# Patient Record
Sex: Female | Born: 1945 | Race: White | Hispanic: No | Marital: Married | Smoking: Former smoker
Health system: Southern US, Community
[De-identification: ages and names within clinical notes are randomized; demographics above are authoritative.]

## PROBLEM LIST (undated history)

## (undated) DIAGNOSIS — I10 Essential (primary) hypertension: Secondary | ICD-10-CM

## (undated) DIAGNOSIS — E7800 Pure hypercholesterolemia, unspecified: Secondary | ICD-10-CM

## (undated) DIAGNOSIS — K219 Gastro-esophageal reflux disease without esophagitis: Secondary | ICD-10-CM

## (undated) HISTORY — PX: ABDOMINAL HYSTERECTOMY: SHX81

---

## 2006-04-16 ENCOUNTER — Ambulatory Visit (HOSPITAL_COMMUNITY): Admission: RE | Admit: 2006-04-16 | Discharge: 2006-04-16 | Payer: Self-pay | Admitting: General Surgery

## 2006-05-12 ENCOUNTER — Ambulatory Visit (HOSPITAL_COMMUNITY): Admission: RE | Admit: 2006-05-12 | Discharge: 2006-05-12 | Payer: Self-pay | Admitting: Pediatrics

## 2007-06-24 ENCOUNTER — Ambulatory Visit (HOSPITAL_COMMUNITY): Admission: RE | Admit: 2007-06-24 | Discharge: 2007-06-24 | Payer: Self-pay | Admitting: Family Medicine

## 2008-11-26 ENCOUNTER — Ambulatory Visit (HOSPITAL_COMMUNITY): Admission: RE | Admit: 2008-11-26 | Discharge: 2008-11-26 | Payer: Self-pay | Admitting: Family Medicine

## 2010-03-04 ENCOUNTER — Ambulatory Visit (HOSPITAL_COMMUNITY): Admission: RE | Admit: 2010-03-04 | Discharge: 2010-03-04 | Payer: Self-pay | Admitting: Family Medicine

## 2010-04-27 ENCOUNTER — Encounter: Payer: Self-pay | Admitting: General Surgery

## 2011-05-26 ENCOUNTER — Other Ambulatory Visit (HOSPITAL_COMMUNITY): Payer: Self-pay | Admitting: Family Medicine

## 2011-05-26 DIAGNOSIS — Z139 Encounter for screening, unspecified: Secondary | ICD-10-CM

## 2011-05-29 ENCOUNTER — Ambulatory Visit (HOSPITAL_COMMUNITY): Payer: Self-pay

## 2011-06-02 ENCOUNTER — Ambulatory Visit (HOSPITAL_COMMUNITY)
Admission: RE | Admit: 2011-06-02 | Discharge: 2011-06-02 | Disposition: A | Discharge status: Home / Self Care | Payer: Self-pay | Source: Ambulatory Visit | Attending: Family Medicine | Admitting: Family Medicine

## 2011-06-02 DIAGNOSIS — Z139 Encounter for screening, unspecified: Secondary | ICD-10-CM

## 2012-08-16 ENCOUNTER — Other Ambulatory Visit (HOSPITAL_COMMUNITY): Payer: Self-pay | Admitting: Internal Medicine

## 2012-08-16 DIAGNOSIS — Z139 Encounter for screening, unspecified: Secondary | ICD-10-CM

## 2012-08-23 ENCOUNTER — Encounter (INDEPENDENT_AMBULATORY_CARE_PROVIDER_SITE_OTHER): Payer: Self-pay | Admitting: *Deleted

## 2012-08-25 ENCOUNTER — Ambulatory Visit (HOSPITAL_COMMUNITY)
Admission: RE | Admit: 2012-08-25 | Discharge: 2012-08-25 | Disposition: A | Discharge status: Home / Self Care | Payer: Medicare HMO | Source: Ambulatory Visit | Attending: Internal Medicine | Admitting: Internal Medicine

## 2012-08-25 DIAGNOSIS — Z1231 Encounter for screening mammogram for malignant neoplasm of breast: Secondary | ICD-10-CM | POA: Insufficient documentation

## 2012-08-25 DIAGNOSIS — Z139 Encounter for screening, unspecified: Secondary | ICD-10-CM

## 2012-12-28 ENCOUNTER — Other Ambulatory Visit (INDEPENDENT_AMBULATORY_CARE_PROVIDER_SITE_OTHER): Payer: Self-pay | Admitting: *Deleted

## 2012-12-28 ENCOUNTER — Telehealth (INDEPENDENT_AMBULATORY_CARE_PROVIDER_SITE_OTHER): Payer: Self-pay | Admitting: *Deleted

## 2012-12-28 ENCOUNTER — Encounter (INDEPENDENT_AMBULATORY_CARE_PROVIDER_SITE_OTHER): Payer: Self-pay | Admitting: *Deleted

## 2012-12-28 DIAGNOSIS — Z1211 Encounter for screening for malignant neoplasm of colon: Secondary | ICD-10-CM

## 2012-12-28 MED ORDER — PEG-KCL-NACL-NASULF-NA ASC-C 100 G PO SOLR: 1.0000 | Freq: Once | ORAL | Status: DC

## 2012-12-28 NOTE — Telephone Encounter (Signed)
Patient needs movi prep 

## 2013-01-24 ENCOUNTER — Encounter (INDEPENDENT_AMBULATORY_CARE_PROVIDER_SITE_OTHER): Payer: Self-pay | Admitting: *Deleted

## 2013-02-06 ENCOUNTER — Telehealth (INDEPENDENT_AMBULATORY_CARE_PROVIDER_SITE_OTHER): Payer: Self-pay | Admitting: *Deleted

## 2013-02-06 NOTE — Telephone Encounter (Signed)
  Procedure: tcs  Reason/Indication:  screening  Has patient had this procedure before?  no  If so, when, by whom and where?    Is there a family history of colon cancer?  no  Who?  What age when diagnosed?    Is patient diabetic?   no      Does patient have prosthetic heart valve?  no  Do you have a pacemaker?  no  Has patient ever had endocarditis? no  Has patient had joint replacement within last 12 months?  no  Does patient tend to be constipated or take laxatives? no  Is patient on Coumadin, Plavix and/or Aspirin? yes  Medications: asa prn, lisinopril 20 mg daily, pravastatin 20 mg daily, fish oil 1000 mg daily, multi vit daily, calcium daily  Allergies: nkda  Medication Adjustment: asa 2 days  Procedure date & time: 02/19/13 at 830

## 2013-02-08 NOTE — Telephone Encounter (Signed)
agree

## 2013-02-13 ENCOUNTER — Other Ambulatory Visit (HOSPITAL_COMMUNITY): Payer: Self-pay | Admitting: Internal Medicine

## 2013-02-13 DIAGNOSIS — M8580 Other specified disorders of bone density and structure, unspecified site: Secondary | ICD-10-CM

## 2013-02-17 ENCOUNTER — Ambulatory Visit (HOSPITAL_COMMUNITY)
Admission: RE | Admit: 2013-02-17 | Discharge: 2013-02-17 | Disposition: A | Discharge status: Home / Self Care | Payer: Medicare HMO | Source: Ambulatory Visit | Attending: Internal Medicine | Admitting: Internal Medicine

## 2013-02-17 DIAGNOSIS — Z780 Asymptomatic menopausal state: Secondary | ICD-10-CM | POA: Insufficient documentation

## 2013-02-17 DIAGNOSIS — M8580 Other specified disorders of bone density and structure, unspecified site: Secondary | ICD-10-CM

## 2013-02-17 DIAGNOSIS — M810 Age-related osteoporosis without current pathological fracture: Secondary | ICD-10-CM | POA: Insufficient documentation

## 2013-02-21 ENCOUNTER — Encounter (HOSPITAL_COMMUNITY): Payer: Self-pay

## 2013-03-01 ENCOUNTER — Encounter (HOSPITAL_COMMUNITY): Payer: Self-pay | Admitting: *Deleted

## 2013-03-01 ENCOUNTER — Ambulatory Visit (HOSPITAL_COMMUNITY)
Admission: RE | Admit: 2013-03-01 | Discharge: 2013-03-01 | Disposition: A | Discharge status: Home / Self Care | Payer: Medicare HMO | Source: Ambulatory Visit | Attending: Internal Medicine | Admitting: Internal Medicine

## 2013-03-01 ENCOUNTER — Encounter (HOSPITAL_COMMUNITY)
Admission: RE | Disposition: A | Discharge status: Home / Self Care | Payer: Self-pay | Source: Ambulatory Visit | Attending: Internal Medicine

## 2013-03-01 DIAGNOSIS — K5730 Diverticulosis of large intestine without perforation or abscess without bleeding: Secondary | ICD-10-CM | POA: Insufficient documentation

## 2013-03-01 DIAGNOSIS — Z1211 Encounter for screening for malignant neoplasm of colon: Secondary | ICD-10-CM

## 2013-03-01 DIAGNOSIS — Z800 Family history of malignant neoplasm of digestive organs: Secondary | ICD-10-CM | POA: Insufficient documentation

## 2013-03-01 DIAGNOSIS — I10 Essential (primary) hypertension: Secondary | ICD-10-CM | POA: Insufficient documentation

## 2013-03-01 HISTORY — DX: Gastro-esophageal reflux disease without esophagitis: K21.9

## 2013-03-01 HISTORY — PX: COLONOSCOPY: SHX5424

## 2013-03-01 HISTORY — DX: Essential (primary) hypertension: I10

## 2013-03-01 SURGERY — COLONOSCOPY
Anesthesia: Moderate Sedation

## 2013-03-01 MED ORDER — MIDAZOLAM HCL 5 MG/5ML IJ SOLN
INTRAMUSCULAR | Status: DC | PRN
  Administered 2013-03-01 (×2): 2 mg via INTRAVENOUS
  Administered 2013-03-01: 1 mg via INTRAVENOUS

## 2013-03-01 MED ORDER — MIDAZOLAM HCL 5 MG/5ML IJ SOLN
INTRAMUSCULAR | Status: AC
  Filled 2013-03-01: qty 10

## 2013-03-01 MED ORDER — MEPERIDINE HCL 50 MG/ML IJ SOLN
INTRAMUSCULAR | Status: DC | PRN
  Administered 2013-03-01: 15 mg via INTRAVENOUS
  Administered 2013-03-01: 25 mg via INTRAVENOUS

## 2013-03-01 MED ORDER — SODIUM CHLORIDE 0.9 % IV SOLN
INTRAVENOUS | Status: DC
  Administered 2013-03-01: 1000 mL via INTRAVENOUS

## 2013-03-01 MED ORDER — STERILE WATER FOR IRRIGATION IR SOLN
Status: DC | PRN
  Administered 2013-03-01: 10:00:00

## 2013-03-01 MED ORDER — MEPERIDINE HCL 50 MG/ML IJ SOLN
INTRAMUSCULAR | Status: AC
  Filled 2013-03-01: qty 1

## 2013-03-01 NOTE — Op Note (Signed)
COLONOSCOPY PROCEDURE REPORT  PATIENT:  Teresa Howe  MR#:  [id number] Birthdate:  Apr 28, 1945, 67 y.o., female Endoscopist:  Dr. Malissa Hippo, MD Referred By:  Dr. Dwana Melena, MD Procedure Date: 03/01/2013  Procedure:   Colonoscopy  Indications:  Patient is  67 year old Caucasian female who is undergoing high-risk screening colonoscopy. Her sister had surgery for colon carcinoma in late 69s and doing fine at age 30. This is patient's first exam.  Informed Consent:  The procedure and risks were reviewed with the patient and informed consent was obtained.  Medications:  Demerol 40 mg IV Versed 5 mg IV  Description of procedure:  After a digital rectal exam was performed, that colonoscope was advanced from the anus through the rectum and colon to the area of the cecum, ileocecal valve and appendiceal orifice. The cecum was deeply intubated. These structures were well-seen and photographed for the record. From the level of the cecum and ileocecal valve, the scope was slowly and cautiously withdrawn. The mucosal surfaces were carefully surveyed utilizing scope tip to flexion to facilitate fold flattening as needed. The scope was pulled down into the rectum where a thorough exam including retroflexion was performed.  Findings:  Prep satisfactory. Scattered diverticula throughout the colon but no polyps or other mucosal abnormalities noted.  Normal rectal mucosa and anorectal junction.   Therapeutic/Diagnostic Maneuvers Performed:   None  Complications:  None  Cecal Withdrawal Time:  8 minutes  Impression:  Examination performed to cecum. Pancolonic diverticulosis.  Recommendations:  Standard instructions given. Next screening exam in 5 years.Marland Kitchen  REHMAN,NAJEEB U  03/01/2013 11:00 AM  CC: Dr. Catalina Pizza, MD & Dr. Bonnetta Barry ref. provider found

## 2013-03-01 NOTE — H&P (Signed)
Teresa Howe is an 67 y.o. female.   Chief Complaint: Patient is here for colonoscopy. HPI: This 67 year old Caucasian female presents for screening colonoscopy. She denies abdominal pain change in bowel habits rectal bleeding. Family history significant for colon carcinoma sister with surgery in her late 77s and now 72 and doing well.  Past Medical History  Diagnosis Date  . Hypertension   . GERD (gastroesophageal reflux disease)     Past Surgical History  Procedure Laterality Date  . Abdominal hysterectomy      No family history on file. Social History:  has no tobacco, alcohol, and drug history on file.  Allergies: No Known Allergies  Medications Prior to Admission  Medication Sig Dispense Refill  . calcium carbonate (OS-CAL) 600 MG TABS tablet Take 600 mg by mouth every other day.      . lisinopril-hydrochlorothiazide (PRINZIDE,ZESTORETIC) 20-12.5 MG per tablet Take 1 tablet by mouth daily.      . Multiple Vitamins-Minerals (MULTIVITAMINS THER. W/MINERALS) TABS tablet Take 1 tablet by mouth daily.      . Omega-3 Fatty Acids (FISH OIL) 1000 MG CAPS Take 1 capsule by mouth 3 (three) times daily.      . peg 3350 powder (MOVIPREP) 100 G SOLR Take 1 kit (200 g total) by mouth once.  1 kit  0  . pravastatin (PRAVACHOL) 20 MG tablet Take 20 mg by mouth at bedtime.        No results found for this or any previous visit (from the past 48 hour(s)). No results found.  ROS  Blood pressure 105/69, pulse 73, temperature 97 F (36.1 C), temperature source Axillary, resp. rate 16, height 4\' 11"  (1.499 m), weight 104 lb (47.174 kg), SpO2 100.00%. Physical Exam  Constitutional: She appears well-developed and well-nourished.  HENT:  Mouth/Throat: Oropharynx is clear and moist.  Eyes: Conjunctivae are normal. No scleral icterus.  Neck: No thyromegaly present.  Cardiovascular: Normal rate, regular rhythm and normal heart sounds.   No murmur heard. Respiratory: Effort normal and  breath sounds normal.  GI: Soft. She exhibits no distension and no mass. There is no tenderness.  Musculoskeletal: She exhibits no edema.  Lymphadenopathy:    She has no cervical adenopathy.  Neurological: She is alert.  Skin: Skin is warm and dry.     Assessment/Plan High risk screening colonoscopy.  Teresa Howe U 03/01/2013, 10:26 AM

## 2013-03-07 ENCOUNTER — Encounter (HOSPITAL_COMMUNITY): Payer: Self-pay | Admitting: Internal Medicine

## 2013-03-16 ENCOUNTER — Ambulatory Visit (INDEPENDENT_AMBULATORY_CARE_PROVIDER_SITE_OTHER): Payer: Medicare HMO | Admitting: Otolaryngology

## 2013-03-16 DIAGNOSIS — H903 Sensorineural hearing loss, bilateral: Secondary | ICD-10-CM

## 2013-08-31 ENCOUNTER — Other Ambulatory Visit (HOSPITAL_COMMUNITY): Payer: Self-pay | Admitting: Internal Medicine

## 2013-08-31 DIAGNOSIS — Z1231 Encounter for screening mammogram for malignant neoplasm of breast: Secondary | ICD-10-CM

## 2013-09-05 ENCOUNTER — Ambulatory Visit (HOSPITAL_COMMUNITY)
Admission: RE | Admit: 2013-09-05 | Discharge: 2013-09-05 | Disposition: A | Discharge status: Home / Self Care | Payer: Medicare HMO | Source: Ambulatory Visit | Attending: Internal Medicine | Admitting: Internal Medicine

## 2013-09-05 DIAGNOSIS — Z1231 Encounter for screening mammogram for malignant neoplasm of breast: Secondary | ICD-10-CM | POA: Insufficient documentation

## 2014-01-03 DIAGNOSIS — Z23 Encounter for immunization: Secondary | ICD-10-CM | POA: Diagnosis not present

## 2014-03-15 ENCOUNTER — Ambulatory Visit (INDEPENDENT_AMBULATORY_CARE_PROVIDER_SITE_OTHER): Payer: Commercial Managed Care - HMO | Admitting: Otolaryngology

## 2014-03-15 DIAGNOSIS — H903 Sensorineural hearing loss, bilateral: Secondary | ICD-10-CM | POA: Diagnosis not present

## 2014-06-27 DIAGNOSIS — R7301 Impaired fasting glucose: Secondary | ICD-10-CM | POA: Diagnosis not present

## 2014-06-27 DIAGNOSIS — E782 Mixed hyperlipidemia: Secondary | ICD-10-CM | POA: Diagnosis not present

## 2014-07-03 DIAGNOSIS — M79602 Pain in left arm: Secondary | ICD-10-CM | POA: Diagnosis not present

## 2014-07-03 DIAGNOSIS — E782 Mixed hyperlipidemia: Secondary | ICD-10-CM | POA: Diagnosis not present

## 2014-07-03 DIAGNOSIS — I10 Essential (primary) hypertension: Secondary | ICD-10-CM | POA: Diagnosis not present

## 2014-07-03 DIAGNOSIS — R7301 Impaired fasting glucose: Secondary | ICD-10-CM | POA: Diagnosis not present

## 2014-08-27 ENCOUNTER — Other Ambulatory Visit (HOSPITAL_COMMUNITY): Payer: Self-pay | Admitting: Internal Medicine

## 2014-08-27 DIAGNOSIS — Z1231 Encounter for screening mammogram for malignant neoplasm of breast: Secondary | ICD-10-CM

## 2014-09-10 ENCOUNTER — Ambulatory Visit (HOSPITAL_COMMUNITY)
Admission: RE | Admit: 2014-09-10 | Discharge: 2014-09-10 | Disposition: A | Discharge status: Home / Self Care | Payer: Medicare Other | Source: Ambulatory Visit | Attending: Internal Medicine | Admitting: Internal Medicine

## 2014-09-10 DIAGNOSIS — Z1231 Encounter for screening mammogram for malignant neoplasm of breast: Secondary | ICD-10-CM | POA: Diagnosis not present

## 2014-09-26 DIAGNOSIS — L570 Actinic keratosis: Secondary | ICD-10-CM | POA: Diagnosis not present

## 2014-09-26 DIAGNOSIS — Z85828 Personal history of other malignant neoplasm of skin: Secondary | ICD-10-CM | POA: Diagnosis not present

## 2014-11-23 DIAGNOSIS — E782 Mixed hyperlipidemia: Secondary | ICD-10-CM | POA: Diagnosis not present

## 2014-11-23 DIAGNOSIS — R7301 Impaired fasting glucose: Secondary | ICD-10-CM | POA: Diagnosis not present

## 2014-11-23 DIAGNOSIS — I10 Essential (primary) hypertension: Secondary | ICD-10-CM | POA: Diagnosis not present

## 2014-11-27 DIAGNOSIS — M810 Age-related osteoporosis without current pathological fracture: Secondary | ICD-10-CM | POA: Diagnosis not present

## 2014-11-27 DIAGNOSIS — R7301 Impaired fasting glucose: Secondary | ICD-10-CM | POA: Diagnosis not present

## 2014-11-27 DIAGNOSIS — R944 Abnormal results of kidney function studies: Secondary | ICD-10-CM | POA: Diagnosis not present

## 2014-11-27 DIAGNOSIS — I10 Essential (primary) hypertension: Secondary | ICD-10-CM | POA: Diagnosis not present

## 2014-11-27 DIAGNOSIS — E785 Hyperlipidemia, unspecified: Secondary | ICD-10-CM | POA: Diagnosis not present

## 2014-12-05 ENCOUNTER — Other Ambulatory Visit (HOSPITAL_COMMUNITY): Payer: Self-pay | Admitting: Internal Medicine

## 2014-12-05 DIAGNOSIS — M810 Age-related osteoporosis without current pathological fracture: Secondary | ICD-10-CM

## 2014-12-12 ENCOUNTER — Ambulatory Visit (HOSPITAL_COMMUNITY)
Admission: RE | Admit: 2014-12-12 | Discharge: 2014-12-12 | Disposition: A | Discharge status: Home / Self Care | Payer: Medicare Other | Source: Ambulatory Visit | Attending: Internal Medicine | Admitting: Internal Medicine

## 2014-12-12 DIAGNOSIS — M810 Age-related osteoporosis without current pathological fracture: Secondary | ICD-10-CM | POA: Diagnosis not present

## 2014-12-12 DIAGNOSIS — Z780 Asymptomatic menopausal state: Secondary | ICD-10-CM | POA: Diagnosis not present

## 2015-01-21 DIAGNOSIS — N390 Urinary tract infection, site not specified: Secondary | ICD-10-CM | POA: Diagnosis not present

## 2015-03-06 DIAGNOSIS — Z23 Encounter for immunization: Secondary | ICD-10-CM | POA: Diagnosis not present

## 2015-03-14 ENCOUNTER — Ambulatory Visit (INDEPENDENT_AMBULATORY_CARE_PROVIDER_SITE_OTHER): Payer: Medicare Other | Admitting: Otolaryngology

## 2015-03-14 DIAGNOSIS — H903 Sensorineural hearing loss, bilateral: Secondary | ICD-10-CM

## 2015-03-25 DIAGNOSIS — L570 Actinic keratosis: Secondary | ICD-10-CM | POA: Diagnosis not present

## 2015-03-25 DIAGNOSIS — Z85828 Personal history of other malignant neoplasm of skin: Secondary | ICD-10-CM | POA: Diagnosis not present

## 2015-09-17 ENCOUNTER — Other Ambulatory Visit (HOSPITAL_COMMUNITY): Payer: Self-pay | Admitting: Internal Medicine

## 2015-09-17 DIAGNOSIS — Z1231 Encounter for screening mammogram for malignant neoplasm of breast: Secondary | ICD-10-CM

## 2015-09-20 ENCOUNTER — Ambulatory Visit (HOSPITAL_COMMUNITY)
Admission: RE | Admit: 2015-09-20 | Discharge: 2015-09-20 | Disposition: A | Discharge status: Home / Self Care | Payer: Medicare HMO | Source: Ambulatory Visit | Attending: Internal Medicine | Admitting: Internal Medicine

## 2015-09-20 DIAGNOSIS — Z1231 Encounter for screening mammogram for malignant neoplasm of breast: Secondary | ICD-10-CM

## 2015-09-20 DIAGNOSIS — R928 Other abnormal and inconclusive findings on diagnostic imaging of breast: Secondary | ICD-10-CM | POA: Insufficient documentation

## 2015-09-30 ENCOUNTER — Other Ambulatory Visit: Payer: Self-pay | Admitting: Internal Medicine

## 2015-09-30 DIAGNOSIS — R928 Other abnormal and inconclusive findings on diagnostic imaging of breast: Secondary | ICD-10-CM

## 2015-10-11 ENCOUNTER — Other Ambulatory Visit (HOSPITAL_COMMUNITY): Payer: Self-pay | Admitting: Internal Medicine

## 2015-10-11 DIAGNOSIS — R928 Other abnormal and inconclusive findings on diagnostic imaging of breast: Secondary | ICD-10-CM

## 2015-10-22 ENCOUNTER — Ambulatory Visit (HOSPITAL_COMMUNITY)
Admission: RE | Admit: 2015-10-22 | Discharge: 2015-10-22 | Disposition: A | Discharge status: Home / Self Care | Payer: Medicare HMO | Source: Ambulatory Visit | Attending: Internal Medicine | Admitting: Internal Medicine

## 2015-10-22 DIAGNOSIS — N63 Unspecified lump in breast: Secondary | ICD-10-CM | POA: Diagnosis not present

## 2015-10-22 DIAGNOSIS — R928 Other abnormal and inconclusive findings on diagnostic imaging of breast: Secondary | ICD-10-CM

## 2016-04-29 DIAGNOSIS — L570 Actinic keratosis: Secondary | ICD-10-CM | POA: Diagnosis not present

## 2016-06-03 DIAGNOSIS — R7301 Impaired fasting glucose: Secondary | ICD-10-CM | POA: Diagnosis not present

## 2016-06-03 DIAGNOSIS — E782 Mixed hyperlipidemia: Secondary | ICD-10-CM | POA: Diagnosis not present

## 2016-06-04 DIAGNOSIS — R7303 Prediabetes: Secondary | ICD-10-CM | POA: Diagnosis not present

## 2016-06-04 DIAGNOSIS — Z681 Body mass index (BMI) 19 or less, adult: Secondary | ICD-10-CM | POA: Diagnosis not present

## 2016-06-04 DIAGNOSIS — Z0000 Encounter for general adult medical examination without abnormal findings: Secondary | ICD-10-CM | POA: Diagnosis not present

## 2016-06-04 DIAGNOSIS — E782 Mixed hyperlipidemia: Secondary | ICD-10-CM | POA: Diagnosis not present

## 2016-06-25 DIAGNOSIS — Z681 Body mass index (BMI) 19 or less, adult: Secondary | ICD-10-CM | POA: Diagnosis not present

## 2016-06-25 DIAGNOSIS — N390 Urinary tract infection, site not specified: Secondary | ICD-10-CM | POA: Diagnosis not present

## 2016-07-13 DIAGNOSIS — Z681 Body mass index (BMI) 19 or less, adult: Secondary | ICD-10-CM | POA: Diagnosis not present

## 2016-07-13 DIAGNOSIS — J060 Acute laryngopharyngitis: Secondary | ICD-10-CM | POA: Diagnosis not present

## 2016-08-20 ENCOUNTER — Other Ambulatory Visit (HOSPITAL_COMMUNITY): Payer: Self-pay | Admitting: Internal Medicine

## 2016-08-20 DIAGNOSIS — Z1231 Encounter for screening mammogram for malignant neoplasm of breast: Secondary | ICD-10-CM

## 2016-09-21 ENCOUNTER — Ambulatory Visit (HOSPITAL_COMMUNITY)
Admission: RE | Admit: 2016-09-21 | Discharge: 2016-09-21 | Disposition: A | Discharge status: Home / Self Care | Payer: Medicare HMO | Source: Ambulatory Visit | Attending: Internal Medicine | Admitting: Internal Medicine

## 2016-09-21 DIAGNOSIS — Z1231 Encounter for screening mammogram for malignant neoplasm of breast: Secondary | ICD-10-CM | POA: Diagnosis not present

## 2016-11-09 DIAGNOSIS — L570 Actinic keratosis: Secondary | ICD-10-CM | POA: Diagnosis not present

## 2016-12-08 DIAGNOSIS — I10 Essential (primary) hypertension: Secondary | ICD-10-CM | POA: Diagnosis not present

## 2016-12-08 DIAGNOSIS — R7301 Impaired fasting glucose: Secondary | ICD-10-CM | POA: Diagnosis not present

## 2016-12-10 DIAGNOSIS — R7303 Prediabetes: Secondary | ICD-10-CM | POA: Diagnosis not present

## 2016-12-10 DIAGNOSIS — E782 Mixed hyperlipidemia: Secondary | ICD-10-CM | POA: Diagnosis not present

## 2016-12-10 DIAGNOSIS — I10 Essential (primary) hypertension: Secondary | ICD-10-CM | POA: Diagnosis not present

## 2016-12-10 DIAGNOSIS — Z681 Body mass index (BMI) 19 or less, adult: Secondary | ICD-10-CM | POA: Diagnosis not present

## 2016-12-10 DIAGNOSIS — Z713 Dietary counseling and surveillance: Secondary | ICD-10-CM | POA: Diagnosis not present

## 2016-12-10 DIAGNOSIS — R944 Abnormal results of kidney function studies: Secondary | ICD-10-CM | POA: Diagnosis not present

## 2016-12-10 DIAGNOSIS — R7301 Impaired fasting glucose: Secondary | ICD-10-CM | POA: Diagnosis not present

## 2016-12-29 DIAGNOSIS — Z23 Encounter for immunization: Secondary | ICD-10-CM | POA: Diagnosis not present

## 2017-05-19 DIAGNOSIS — E785 Hyperlipidemia, unspecified: Secondary | ICD-10-CM | POA: Diagnosis not present

## 2017-05-19 DIAGNOSIS — Z7983 Long term (current) use of bisphosphonates: Secondary | ICD-10-CM | POA: Diagnosis not present

## 2017-05-19 DIAGNOSIS — I10 Essential (primary) hypertension: Secondary | ICD-10-CM | POA: Diagnosis not present

## 2017-05-19 DIAGNOSIS — R69 Illness, unspecified: Secondary | ICD-10-CM | POA: Diagnosis not present

## 2017-05-19 DIAGNOSIS — Z87891 Personal history of nicotine dependence: Secondary | ICD-10-CM | POA: Diagnosis not present

## 2017-05-19 DIAGNOSIS — M810 Age-related osteoporosis without current pathological fracture: Secondary | ICD-10-CM | POA: Diagnosis not present

## 2017-05-19 DIAGNOSIS — Z809 Family history of malignant neoplasm, unspecified: Secondary | ICD-10-CM | POA: Diagnosis not present

## 2017-05-19 DIAGNOSIS — Z833 Family history of diabetes mellitus: Secondary | ICD-10-CM | POA: Diagnosis not present

## 2017-06-08 DIAGNOSIS — R7302 Impaired glucose tolerance (oral): Secondary | ICD-10-CM | POA: Diagnosis not present

## 2017-06-08 DIAGNOSIS — I10 Essential (primary) hypertension: Secondary | ICD-10-CM | POA: Diagnosis not present

## 2017-06-08 DIAGNOSIS — E782 Mixed hyperlipidemia: Secondary | ICD-10-CM | POA: Diagnosis not present

## 2017-06-10 DIAGNOSIS — Z0000 Encounter for general adult medical examination without abnormal findings: Secondary | ICD-10-CM | POA: Diagnosis not present

## 2017-06-10 DIAGNOSIS — M810 Age-related osteoporosis without current pathological fracture: Secondary | ICD-10-CM | POA: Diagnosis not present

## 2017-06-10 DIAGNOSIS — E782 Mixed hyperlipidemia: Secondary | ICD-10-CM | POA: Diagnosis not present

## 2017-06-10 DIAGNOSIS — R7303 Prediabetes: Secondary | ICD-10-CM | POA: Diagnosis not present

## 2017-06-10 DIAGNOSIS — I10 Essential (primary) hypertension: Secondary | ICD-10-CM | POA: Diagnosis not present

## 2017-06-10 DIAGNOSIS — Z681 Body mass index (BMI) 19 or less, adult: Secondary | ICD-10-CM | POA: Diagnosis not present

## 2017-06-23 DIAGNOSIS — L570 Actinic keratosis: Secondary | ICD-10-CM | POA: Diagnosis not present

## 2017-07-27 ENCOUNTER — Other Ambulatory Visit (HOSPITAL_COMMUNITY): Payer: Self-pay | Admitting: Adult Health Nurse Practitioner

## 2017-07-27 ENCOUNTER — Ambulatory Visit (HOSPITAL_COMMUNITY)
Admission: RE | Admit: 2017-07-27 | Discharge: 2017-07-27 | Disposition: A | Discharge status: Home / Self Care | Payer: Medicare HMO | Source: Ambulatory Visit | Attending: Adult Health Nurse Practitioner | Admitting: Adult Health Nurse Practitioner

## 2017-07-27 DIAGNOSIS — I10 Essential (primary) hypertension: Secondary | ICD-10-CM | POA: Diagnosis not present

## 2017-07-27 DIAGNOSIS — R0781 Pleurodynia: Secondary | ICD-10-CM | POA: Diagnosis not present

## 2017-07-27 DIAGNOSIS — R071 Chest pain on breathing: Secondary | ICD-10-CM | POA: Insufficient documentation

## 2017-07-27 DIAGNOSIS — Z0000 Encounter for general adult medical examination without abnormal findings: Secondary | ICD-10-CM | POA: Diagnosis not present

## 2017-07-27 DIAGNOSIS — R7303 Prediabetes: Secondary | ICD-10-CM | POA: Diagnosis not present

## 2017-07-27 DIAGNOSIS — Z681 Body mass index (BMI) 19 or less, adult: Secondary | ICD-10-CM | POA: Diagnosis not present

## 2017-07-27 DIAGNOSIS — R079 Chest pain, unspecified: Secondary | ICD-10-CM | POA: Diagnosis not present

## 2017-07-27 DIAGNOSIS — M810 Age-related osteoporosis without current pathological fracture: Secondary | ICD-10-CM | POA: Diagnosis not present

## 2017-07-27 DIAGNOSIS — E782 Mixed hyperlipidemia: Secondary | ICD-10-CM | POA: Diagnosis not present

## 2017-08-16 ENCOUNTER — Other Ambulatory Visit (HOSPITAL_COMMUNITY): Payer: Self-pay | Admitting: Internal Medicine

## 2017-08-16 DIAGNOSIS — Z1231 Encounter for screening mammogram for malignant neoplasm of breast: Secondary | ICD-10-CM

## 2017-09-23 ENCOUNTER — Ambulatory Visit (HOSPITAL_COMMUNITY): Payer: Self-pay

## 2017-09-27 ENCOUNTER — Ambulatory Visit (HOSPITAL_COMMUNITY): Payer: Self-pay

## 2017-09-29 ENCOUNTER — Ambulatory Visit (HOSPITAL_COMMUNITY)
Admission: RE | Admit: 2017-09-29 | Discharge: 2017-09-29 | Disposition: A | Discharge status: Home / Self Care | Payer: Medicare HMO | Source: Ambulatory Visit | Attending: Internal Medicine | Admitting: Internal Medicine

## 2017-09-29 ENCOUNTER — Encounter (HOSPITAL_COMMUNITY): Payer: Self-pay

## 2017-09-29 DIAGNOSIS — Z1231 Encounter for screening mammogram for malignant neoplasm of breast: Secondary | ICD-10-CM | POA: Diagnosis not present

## 2017-11-05 DIAGNOSIS — Z681 Body mass index (BMI) 19 or less, adult: Secondary | ICD-10-CM | POA: Diagnosis not present

## 2017-11-05 DIAGNOSIS — M810 Age-related osteoporosis without current pathological fracture: Secondary | ICD-10-CM | POA: Diagnosis not present

## 2017-11-05 DIAGNOSIS — Z0000 Encounter for general adult medical examination without abnormal findings: Secondary | ICD-10-CM | POA: Diagnosis not present

## 2017-11-05 DIAGNOSIS — R7303 Prediabetes: Secondary | ICD-10-CM | POA: Diagnosis not present

## 2017-11-05 DIAGNOSIS — I10 Essential (primary) hypertension: Secondary | ICD-10-CM | POA: Diagnosis not present

## 2017-11-05 DIAGNOSIS — R0781 Pleurodynia: Secondary | ICD-10-CM | POA: Diagnosis not present

## 2017-11-05 DIAGNOSIS — E782 Mixed hyperlipidemia: Secondary | ICD-10-CM | POA: Diagnosis not present

## 2017-11-11 ENCOUNTER — Other Ambulatory Visit: Payer: Self-pay | Admitting: Internal Medicine

## 2017-11-11 DIAGNOSIS — H9190 Unspecified hearing loss, unspecified ear: Secondary | ICD-10-CM | POA: Diagnosis not present

## 2017-11-11 DIAGNOSIS — R7303 Prediabetes: Secondary | ICD-10-CM | POA: Diagnosis not present

## 2017-11-11 DIAGNOSIS — E782 Mixed hyperlipidemia: Secondary | ICD-10-CM | POA: Diagnosis not present

## 2017-11-11 DIAGNOSIS — Z780 Asymptomatic menopausal state: Secondary | ICD-10-CM

## 2017-11-11 DIAGNOSIS — R944 Abnormal results of kidney function studies: Secondary | ICD-10-CM | POA: Diagnosis not present

## 2017-11-11 DIAGNOSIS — M810 Age-related osteoporosis without current pathological fracture: Secondary | ICD-10-CM | POA: Diagnosis not present

## 2017-11-11 DIAGNOSIS — Z681 Body mass index (BMI) 19 or less, adult: Secondary | ICD-10-CM | POA: Diagnosis not present

## 2017-11-11 DIAGNOSIS — I10 Essential (primary) hypertension: Secondary | ICD-10-CM | POA: Diagnosis not present

## 2017-12-15 ENCOUNTER — Ambulatory Visit (HOSPITAL_COMMUNITY)
Admission: RE | Admit: 2017-12-15 | Discharge: 2017-12-15 | Disposition: A | Discharge status: Home / Self Care | Payer: Medicare HMO | Source: Ambulatory Visit | Attending: Internal Medicine | Admitting: Internal Medicine

## 2017-12-15 DIAGNOSIS — M810 Age-related osteoporosis without current pathological fracture: Secondary | ICD-10-CM | POA: Diagnosis not present

## 2017-12-15 DIAGNOSIS — Z780 Asymptomatic menopausal state: Secondary | ICD-10-CM

## 2017-12-27 DIAGNOSIS — L570 Actinic keratosis: Secondary | ICD-10-CM | POA: Diagnosis not present

## 2017-12-30 DIAGNOSIS — H9190 Unspecified hearing loss, unspecified ear: Secondary | ICD-10-CM | POA: Diagnosis not present

## 2017-12-30 DIAGNOSIS — R944 Abnormal results of kidney function studies: Secondary | ICD-10-CM | POA: Diagnosis not present

## 2017-12-30 DIAGNOSIS — M810 Age-related osteoporosis without current pathological fracture: Secondary | ICD-10-CM | POA: Diagnosis not present

## 2017-12-30 DIAGNOSIS — Z0000 Encounter for general adult medical examination without abnormal findings: Secondary | ICD-10-CM | POA: Diagnosis not present

## 2017-12-30 DIAGNOSIS — I10 Essential (primary) hypertension: Secondary | ICD-10-CM | POA: Diagnosis not present

## 2017-12-30 DIAGNOSIS — R0781 Pleurodynia: Secondary | ICD-10-CM | POA: Diagnosis not present

## 2017-12-30 DIAGNOSIS — R7303 Prediabetes: Secondary | ICD-10-CM | POA: Diagnosis not present

## 2017-12-30 DIAGNOSIS — E782 Mixed hyperlipidemia: Secondary | ICD-10-CM | POA: Diagnosis not present

## 2017-12-30 DIAGNOSIS — Z681 Body mass index (BMI) 19 or less, adult: Secondary | ICD-10-CM | POA: Diagnosis not present

## 2018-01-03 DIAGNOSIS — M8580 Other specified disorders of bone density and structure, unspecified site: Secondary | ICD-10-CM | POA: Diagnosis not present

## 2018-01-03 DIAGNOSIS — Z681 Body mass index (BMI) 19 or less, adult: Secondary | ICD-10-CM | POA: Diagnosis not present

## 2018-02-15 DIAGNOSIS — N390 Urinary tract infection, site not specified: Secondary | ICD-10-CM | POA: Diagnosis not present

## 2018-02-21 DIAGNOSIS — Z23 Encounter for immunization: Secondary | ICD-10-CM | POA: Diagnosis not present

## 2018-02-28 DIAGNOSIS — L570 Actinic keratosis: Secondary | ICD-10-CM | POA: Diagnosis not present

## 2018-02-28 DIAGNOSIS — D485 Neoplasm of uncertain behavior of skin: Secondary | ICD-10-CM | POA: Diagnosis not present

## 2018-02-28 DIAGNOSIS — D0461 Carcinoma in situ of skin of right upper limb, including shoulder: Secondary | ICD-10-CM | POA: Diagnosis not present

## 2018-02-28 DIAGNOSIS — Z85828 Personal history of other malignant neoplasm of skin: Secondary | ICD-10-CM | POA: Diagnosis not present

## 2018-03-08 ENCOUNTER — Encounter (INDEPENDENT_AMBULATORY_CARE_PROVIDER_SITE_OTHER): Payer: Self-pay | Admitting: *Deleted

## 2018-03-10 DIAGNOSIS — C44622 Squamous cell carcinoma of skin of right upper limb, including shoulder: Secondary | ICD-10-CM | POA: Diagnosis not present

## 2018-03-25 ENCOUNTER — Other Ambulatory Visit (INDEPENDENT_AMBULATORY_CARE_PROVIDER_SITE_OTHER): Payer: Self-pay | Admitting: *Deleted

## 2018-03-25 DIAGNOSIS — Z800 Family history of malignant neoplasm of digestive organs: Secondary | ICD-10-CM

## 2018-04-20 DIAGNOSIS — R1030 Lower abdominal pain, unspecified: Secondary | ICD-10-CM | POA: Diagnosis not present

## 2018-04-20 DIAGNOSIS — K219 Gastro-esophageal reflux disease without esophagitis: Secondary | ICD-10-CM | POA: Diagnosis not present

## 2018-05-18 DIAGNOSIS — R944 Abnormal results of kidney function studies: Secondary | ICD-10-CM | POA: Diagnosis not present

## 2018-05-18 DIAGNOSIS — E782 Mixed hyperlipidemia: Secondary | ICD-10-CM | POA: Diagnosis not present

## 2018-05-18 DIAGNOSIS — I10 Essential (primary) hypertension: Secondary | ICD-10-CM | POA: Diagnosis not present

## 2018-05-18 DIAGNOSIS — R7303 Prediabetes: Secondary | ICD-10-CM | POA: Diagnosis not present

## 2018-05-25 DIAGNOSIS — E782 Mixed hyperlipidemia: Secondary | ICD-10-CM | POA: Diagnosis not present

## 2018-05-25 DIAGNOSIS — Z0000 Encounter for general adult medical examination without abnormal findings: Secondary | ICD-10-CM | POA: Diagnosis not present

## 2018-05-25 DIAGNOSIS — I10 Essential (primary) hypertension: Secondary | ICD-10-CM | POA: Diagnosis not present

## 2018-05-25 DIAGNOSIS — Z716 Tobacco abuse counseling: Secondary | ICD-10-CM | POA: Diagnosis not present

## 2018-05-25 DIAGNOSIS — R944 Abnormal results of kidney function studies: Secondary | ICD-10-CM | POA: Diagnosis not present

## 2018-05-25 DIAGNOSIS — K219 Gastro-esophageal reflux disease without esophagitis: Secondary | ICD-10-CM | POA: Diagnosis not present

## 2018-05-25 DIAGNOSIS — R69 Illness, unspecified: Secondary | ICD-10-CM | POA: Diagnosis not present

## 2018-05-25 DIAGNOSIS — M810 Age-related osteoporosis without current pathological fracture: Secondary | ICD-10-CM | POA: Diagnosis not present

## 2018-05-25 DIAGNOSIS — R7303 Prediabetes: Secondary | ICD-10-CM | POA: Diagnosis not present

## 2018-06-07 ENCOUNTER — Encounter (INDEPENDENT_AMBULATORY_CARE_PROVIDER_SITE_OTHER): Payer: Self-pay | Admitting: *Deleted

## 2018-06-07 ENCOUNTER — Telehealth (INDEPENDENT_AMBULATORY_CARE_PROVIDER_SITE_OTHER): Payer: Self-pay | Admitting: *Deleted

## 2018-06-07 NOTE — Telephone Encounter (Signed)
Patient needs trilyte 

## 2018-06-08 MED ORDER — PEG 3350-KCL-NA BICARB-NACL 420 G PO SOLR: 4000.0000 mL | Freq: Once | ORAL | 0 refills | Status: AC

## 2018-06-20 DIAGNOSIS — J0100 Acute maxillary sinusitis, unspecified: Secondary | ICD-10-CM | POA: Diagnosis not present

## 2018-06-20 DIAGNOSIS — R509 Fever, unspecified: Secondary | ICD-10-CM | POA: Diagnosis not present

## 2018-06-20 DIAGNOSIS — B349 Viral infection, unspecified: Secondary | ICD-10-CM | POA: Diagnosis not present

## 2018-06-27 DIAGNOSIS — L570 Actinic keratosis: Secondary | ICD-10-CM | POA: Diagnosis not present

## 2018-06-30 ENCOUNTER — Telehealth (INDEPENDENT_AMBULATORY_CARE_PROVIDER_SITE_OTHER): Payer: Self-pay | Admitting: *Deleted

## 2018-06-30 NOTE — Telephone Encounter (Signed)
agree

## 2018-06-30 NOTE — Telephone Encounter (Signed)
Referring MD/PCP: hall   Procedure: tcs  Reason/Indication:  fam hx colon ca  Has patient had this procedure before?  Yes, 2014  If so, when, by whom and where?    Is there a family history of colon cancer?  Yes, father  Who?  What age when diagnosed?    Is patient diabetic?   no      Does patient have prosthetic heart valve or mechanical valve?  no  Do you have a pacemaker?  no  Has patient ever had endocarditis? no  Has patient had joint replacement within last 12 months?  no  Is patient constipated or do they take laxatives? no  Does patient have a history of alcohol/drug use?  no  Is patient on blood thinner such as Coumadin, Plavix and/or Aspirin? no  Medications: lisinopril 20 mg daily, atorvastatin 40 mg daily, alendronate 70 mg daily, fish oil, vit d3 daily, calcium daily  Allergies:   Medication Adjustment per Dr Lindi Adie, NP:   Procedure date & time: 07/20/18 at 930

## 2018-08-24 ENCOUNTER — Other Ambulatory Visit (HOSPITAL_COMMUNITY): Payer: Self-pay | Admitting: Internal Medicine

## 2018-08-24 DIAGNOSIS — Z1231 Encounter for screening mammogram for malignant neoplasm of breast: Secondary | ICD-10-CM

## 2018-10-10 ENCOUNTER — Encounter (INDEPENDENT_AMBULATORY_CARE_PROVIDER_SITE_OTHER): Payer: Self-pay | Admitting: *Deleted

## 2018-10-17 ENCOUNTER — Ambulatory Visit (HOSPITAL_COMMUNITY)
Admission: RE | Admit: 2018-10-17 | Discharge: 2018-10-17 | Disposition: A | Discharge status: Home / Self Care | Payer: Medicare HMO | Source: Ambulatory Visit | Attending: Internal Medicine | Admitting: Internal Medicine

## 2018-10-17 ENCOUNTER — Other Ambulatory Visit: Payer: Self-pay

## 2018-10-17 DIAGNOSIS — Z1231 Encounter for screening mammogram for malignant neoplasm of breast: Secondary | ICD-10-CM | POA: Insufficient documentation

## 2018-10-18 ENCOUNTER — Telehealth (INDEPENDENT_AMBULATORY_CARE_PROVIDER_SITE_OTHER): Payer: Self-pay | Admitting: *Deleted

## 2018-10-18 NOTE — Telephone Encounter (Signed)
Referring MD/PCP: hall   Procedure: tcs  Reason/Indication:  fam hx colon ca  Has patient had this procedure before?  Yes, 2014  If so, when, by whom and where?    Is there a family history of colon cancer?  Yes, father  Who?  What age when diagnosed?    Is patient diabetic?   no      Does patient have prosthetic heart valve or mechanical valve?  no  Do you have a pacemaker?  no  Has patient ever had endocarditis? no  Has patient had joint replacement within last 12 months?  no  Is patient constipated or do they take laxatives? no  Does patient have a history of alcohol/drug use?  no  Is patient on blood thinner such as Coumadin, Plavix and/or Aspirin? no  Medications: lisinopril 20 mg daily, atorvastatin 40 mg daily, alendronate 70 mg daily, fish oil daily, vit d3 daily, calcium daily  Allergies: nkda  Medication Adjustment per Dr Lindi Adie, NP:   Procedure date & time: 11/16/18 at 930

## 2018-10-19 NOTE — Telephone Encounter (Signed)
agree

## 2018-10-28 DIAGNOSIS — Z0000 Encounter for general adult medical examination without abnormal findings: Secondary | ICD-10-CM | POA: Diagnosis not present

## 2018-11-14 ENCOUNTER — Other Ambulatory Visit: Payer: Self-pay

## 2018-11-14 ENCOUNTER — Other Ambulatory Visit (HOSPITAL_COMMUNITY)
Admission: RE | Admit: 2018-11-14 | Discharge: 2018-11-14 | Disposition: A | Discharge status: Home / Self Care | Payer: Medicare HMO | Source: Ambulatory Visit | Attending: Internal Medicine | Admitting: Internal Medicine

## 2018-11-14 DIAGNOSIS — Z01812 Encounter for preprocedural laboratory examination: Secondary | ICD-10-CM | POA: Insufficient documentation

## 2018-11-14 DIAGNOSIS — Z20828 Contact with and (suspected) exposure to other viral communicable diseases: Secondary | ICD-10-CM | POA: Diagnosis not present

## 2018-11-14 LAB — SARS CORONAVIRUS 2 (TAT 6-24 HRS): SARS Coronavirus 2: NEGATIVE

## 2018-11-16 ENCOUNTER — Encounter (HOSPITAL_COMMUNITY): Payer: Self-pay | Admitting: *Deleted

## 2018-11-16 ENCOUNTER — Other Ambulatory Visit: Payer: Self-pay

## 2018-11-16 ENCOUNTER — Ambulatory Visit (HOSPITAL_COMMUNITY)
Admission: RE | Admit: 2018-11-16 | Discharge: 2018-11-16 | Disposition: A | Discharge status: Home / Self Care | Payer: Medicare HMO | Source: Ambulatory Visit | Attending: Internal Medicine | Admitting: Internal Medicine

## 2018-11-16 ENCOUNTER — Encounter (HOSPITAL_COMMUNITY)
Admission: RE | Disposition: A | Discharge status: Home / Self Care | Payer: Self-pay | Source: Ambulatory Visit | Attending: Internal Medicine

## 2018-11-16 DIAGNOSIS — Z79899 Other long term (current) drug therapy: Secondary | ICD-10-CM | POA: Diagnosis not present

## 2018-11-16 DIAGNOSIS — E7800 Pure hypercholesterolemia, unspecified: Secondary | ICD-10-CM | POA: Insufficient documentation

## 2018-11-16 DIAGNOSIS — D125 Benign neoplasm of sigmoid colon: Secondary | ICD-10-CM | POA: Diagnosis not present

## 2018-11-16 DIAGNOSIS — K219 Gastro-esophageal reflux disease without esophagitis: Secondary | ICD-10-CM | POA: Diagnosis not present

## 2018-11-16 DIAGNOSIS — I10 Essential (primary) hypertension: Secondary | ICD-10-CM | POA: Insufficient documentation

## 2018-11-16 DIAGNOSIS — R69 Illness, unspecified: Secondary | ICD-10-CM | POA: Diagnosis not present

## 2018-11-16 DIAGNOSIS — K5730 Diverticulosis of large intestine without perforation or abscess without bleeding: Secondary | ICD-10-CM | POA: Diagnosis not present

## 2018-11-16 DIAGNOSIS — Z1211 Encounter for screening for malignant neoplasm of colon: Secondary | ICD-10-CM | POA: Diagnosis not present

## 2018-11-16 DIAGNOSIS — Z800 Family history of malignant neoplasm of digestive organs: Secondary | ICD-10-CM | POA: Diagnosis not present

## 2018-11-16 DIAGNOSIS — F17210 Nicotine dependence, cigarettes, uncomplicated: Secondary | ICD-10-CM | POA: Insufficient documentation

## 2018-11-16 HISTORY — PX: COLONOSCOPY: SHX5424

## 2018-11-16 HISTORY — DX: Pure hypercholesterolemia, unspecified: E78.00

## 2018-11-16 HISTORY — PX: POLYPECTOMY: SHX5525

## 2018-11-16 SURGERY — COLONOSCOPY
Anesthesia: Moderate Sedation

## 2018-11-16 MED ORDER — STERILE WATER FOR IRRIGATION IR SOLN
Status: DC | PRN
  Administered 2018-11-16: 09:00:00 2.5 mL

## 2018-11-16 MED ORDER — MEPERIDINE HCL 50 MG/ML IJ SOLN
INTRAMUSCULAR | Status: DC | PRN
  Administered 2018-11-16 (×2): 25 mg via INTRAVENOUS

## 2018-11-16 MED ORDER — SODIUM CHLORIDE 0.9 % IV SOLN
INTRAVENOUS | Status: DC
  Administered 2018-11-16: 09:00:00 via INTRAVENOUS

## 2018-11-16 MED ORDER — MIDAZOLAM HCL 5 MG/5ML IJ SOLN
INTRAMUSCULAR | Status: AC
  Filled 2018-11-16: qty 10

## 2018-11-16 MED ORDER — MEPERIDINE HCL 50 MG/ML IJ SOLN
INTRAMUSCULAR | Status: AC
  Filled 2018-11-16: qty 1

## 2018-11-16 MED ORDER — MIDAZOLAM HCL 5 MG/5ML IJ SOLN
INTRAMUSCULAR | Status: DC | PRN
  Administered 2018-11-16: 1 mg via INTRAVENOUS
  Administered 2018-11-16: 2 mg via INTRAVENOUS
  Administered 2018-11-16: 1 mg via INTRAVENOUS
  Administered 2018-11-16: 2 mg via INTRAVENOUS
  Administered 2018-11-16: 1 mg via INTRAVENOUS

## 2018-11-16 NOTE — Discharge Instructions (Signed)
No aspirin or NSAIDs for 24 hours. °Resume usual medications as before. °High-fiber diet. °No driving for 24 hours. °Physician will call with biopsy results. ° ° °Colonoscopy, Adult, Care After °This sheet gives you information about how to care for yourself after your procedure. Your doctor may also give you more specific instructions. If you have problems or questions, call your doctor. °What can I expect after the procedure? °After the procedure, it is common to have: °· A small amount of blood in your poop for 24 hours. °· Some gas. °· Mild cramping or bloating in your belly. °Follow these instructions at home: °General instructions °· For the first 24 hours after the procedure: °? Do not drive or use machinery. °? Do not sign important documents. °? Do not drink alcohol. °? Do your daily activities more slowly than normal. °? Eat foods that are soft and easy to digest. °· Take over-the-counter or prescription medicines only as told by your doctor. °To help cramping and bloating: ° °· Try walking around. °· Put heat on your belly (abdomen) as told by your doctor. Use a heat source that your doctor recommends, such as a moist heat pack or a heating pad. °? Put a towel between your skin and the heat source. °? Leave the heat on for 20-30 minutes. °? Remove the heat if your skin turns bright red. This is especially important if you cannot feel pain, heat, or cold. You can get burned. °Eating and drinking ° °· Drink enough fluid to keep your pee (urine) clear or pale yellow. °· Return to your normal diet as told by your doctor. Avoid heavy or fried foods that are hard to digest. °· Avoid drinking alcohol for as long as told by your doctor. °Contact a doctor if: °· You have blood in your poop (stool) 2-3 days after the procedure. °Get help right away if: °· You have more than a small amount of blood in your poop. °· You see large clumps of tissue (blood clots) in your poop. °· Your belly is swollen. °· You feel sick  to your stomach (nauseous). °· You throw up (vomit). °· You have a fever. °· You have belly pain that gets worse, and medicine does not help your pain. °Summary °· After the procedure, it is common to have a small amount of blood in your poop. You may also have mild cramping and bloating in your belly. °· For the first 24 hours after the procedure, do not drive or use machinery, do not sign important documents, and do not drink alcohol. °· Get help right away if you have a lot of blood in your poop, feel sick to your stomach, have a fever, or have more belly pain. °This information is not intended to replace advice given to you by your health care provider. Make sure you discuss any questions you have with your health care provider. °Document Released: 04/25/2010 Document Revised: 01/21/2017 Document Reviewed: 12/16/2015 °Elsevier Patient Education © 2020 [employer] ° °Diverticulosis ° °Diverticulosis is a condition that develops when small pouches (diverticula) form in the wall of the large intestine (colon). The colon is where water is absorbed and stool is formed. The pouches form when the inside layer of the colon pushes through weak spots in the outer layers of the colon. You may have a few pouches or many of them. °What are the causes? °The cause of this condition is not known. °What increases the risk? °The following factors may make you   more likely to develop this condition: °· Being older than age 60. Your risk for this condition increases with age. Diverticulosis is rare among people younger than age 30. By age 80, many people have it. °· Eating a low-fiber diet. °· Having frequent constipation. °· Being overweight. °· Not getting enough exercise. °· Smoking. °· Taking over-the-counter pain medicines, like aspirin and ibuprofen. °· Having a family history of diverticulosis. °What are the signs or symptoms? °In most people, there are no symptoms of this condition. If you do have symptoms, they may  include: °· Bloating. °· Cramps in the abdomen. °· Constipation or diarrhea. °· Pain in the lower left side of the abdomen. °How is this diagnosed? °This condition is most often diagnosed during an exam for other colon problems. Because diverticulosis usually has no symptoms, it often cannot be diagnosed independently. This condition may be diagnosed by: °· Using a flexible scope to examine the colon (colonoscopy). °· Taking an X-ray of the colon after dye has been put into the colon (barium enema). °· Doing a CT scan. °How is this treated? °You may not need treatment for this condition if you have never developed an infection related to diverticulosis. If you have had an infection before, treatment may include: °· Eating a high-fiber diet. This may include eating more fruits, vegetables, and grains. °· Taking a fiber supplement. °· Taking a live bacteria supplement (probiotic). °· Taking medicine to relax your colon. °· Taking antibiotic medicines. °Follow these instructions at home: °· Drink 6-8 glasses of water or more each day to prevent constipation. °· Try not to strain when you have a bowel movement. °· If you have had an infection before: °? Eat more fiber as directed by your health care provider or your diet and nutrition specialist (dietitian). °? Take a fiber supplement or probiotic, if your health care provider approves. °· Take over-the-counter and prescription medicines only as told by your health care provider. °· If you were prescribed an antibiotic, take it as told by your health care provider. Do not stop taking the antibiotic even if you start to feel better. °· Keep all follow-up visits as told by your health care provider. This is important. °Contact a health care provider if: °· You have pain in your abdomen. °· You have bloating. °· You have cramps. °· You have not had a bowel movement in 3 days. °Get help right away if: °· Your pain gets worse. °· Your bloating becomes very bad. °· You have a  fever or chills, and your symptoms suddenly get worse. °· You vomit. °· You have bowel movements that are bloody or black. °· You have bleeding from your rectum. °Summary °· Diverticulosis is a condition that develops when small pouches (diverticula) form in the wall of the large intestine (colon). °· You may have a few pouches or many of them. °· This condition is most often diagnosed during an exam for other colon problems. °· If you have had an infection related to diverticulosis, treatment may include increasing the fiber in your diet, taking supplements, or taking medicines. °This information is not intended to replace advice given to you by your health care provider. Make sure you discuss any questions you have with your health care provider. °Document Released: 12/19/2003 Document Revised: 03/05/2017 Document Reviewed: 02/10/2016 °Elsevier Patient Education © 2020 [employer] ° °Colon Polyps ° °Polyps are tissue growths inside the body. Polyps can grow in many places, including the large intestine (colon). A polyp   may be a round bump or a mushroom-shaped growth. You could have one polyp or several. °Most colon polyps are noncancerous (benign). However, some colon polyps can become cancerous over time. Finding and removing the polyps early can help prevent this. °What are the causes? °The exact cause of colon polyps is not known. °What increases the risk? °You are more likely to develop this condition if you: °· Have a family history of colon cancer or colon polyps. °· Are older than 50 or older than 45 if you are African American. °· Have inflammatory bowel disease, such as ulcerative colitis or Crohn's disease. °· Have certain hereditary conditions, such as: °? Familial adenomatous polyposis. °? Lynch syndrome. °? Turcot syndrome. °? Peutz-Jeghers syndrome. °· Are overweight. °· Smoke cigarettes. °· Do not get enough exercise. °· Drink too much alcohol. °· Eat a diet that is high in fat and red meat and  low in fiber. °· Had childhood cancer that was treated with abdominal radiation. °What are the signs or symptoms? °Most polyps do not cause symptoms. °If you have symptoms, they may include: °· Blood coming from your rectum when having a bowel movement. °· Blood in your stool. The stool may look dark red or black. °· Abdominal pain. °· A change in bowel habits, such as constipation or diarrhea. °How is this diagnosed? °This condition is diagnosed with a colonoscopy. This is a procedure in which a lighted, flexible scope is inserted into the anus and then passed into the colon to examine the area. Polyps are sometimes found when a colonoscopy is done as part of routine cancer screening tests. °How is this treated? °Treatment for this condition involves removing any polyps that are found. Most polyps can be removed during a colonoscopy. Those polyps will then be tested for cancer. Additional treatment may be needed depending on the results of testing. °Follow these instructions at home: °Lifestyle °· Maintain a healthy weight, or lose weight if recommended by your health care provider. °· Exercise every day or as told by your health care provider. °· Do not use any products that contain nicotine or tobacco, such as cigarettes and e-cigarettes. If you need help quitting, ask your health care provider. °· If you drink alcohol, limit how much you have: °? 0-1 drink a day for women. °? 0-2 drinks a day for men. °· Be aware of how much alcohol is in your drink. In the [place] one drink equals one 12 oz bottle of beer (355 mL), one 5 oz glass of wine (148 mL), or one 1½ oz shot of hard liquor (44 mL). °Eating and drinking ° °· Eat foods that are high in fiber, such as fruits, vegetables, and whole grains. °· Eat foods that are high in calcium and vitamin D, such as milk, cheese, yogurt, eggs, liver, fish, and broccoli. °· Limit foods that are high in fat, such as fried foods and desserts. °· Limit the amount of red meat and  processed meat you eat, such as hot dogs, sausage, bacon, and lunch meats. °General instructions °· Keep all follow-up visits as told by your health care provider. This is important. °? This includes having regularly scheduled colonoscopies. °? Talk to your health care provider about when you need a colonoscopy. °Contact a health care provider if: °· You have new or worsening bleeding during a bowel movement. °· You have new or increased blood in your stool. °· You have a change in bowel habits. °· You lose weight for no   known reason. °Summary °· Polyps are tissue growths inside the body. Polyps can grow in many places, including the colon. °· Most colon polyps are noncancerous (benign), but some can become cancerous over time. °· This condition is diagnosed with a colonoscopy. °· Treatment for this condition involves removing any polyps that are found. Most polyps can be removed during a colonoscopy. °This information is not intended to replace advice given to you by your health care provider. Make sure you discuss any questions you have with your health care provider. °Document Released: 12/18/2003 Document Revised: 07/08/2017 Document Reviewed: 07/08/2017 °Elsevier Patient Education © 2020 [employer] ° ° ° °

## 2018-11-16 NOTE — Op Note (Signed)
[facility] Patient Name: Teresa Howe Procedure Date: 11/16/2018 9:09 AM MRN: [id number] Date of Birth: [birth year] Attending MD: Hildred Laser , MD CSN: [id number] Age: 73 Admit Type: Outpatient Procedure:                Colonoscopy Indications:              Screening for colorectal malignant neoplasm,                            Screening in patient at increased risk: Colorectal                            cancer in father 42 or older Providers:                Hildred Laser, MD, Otis Peak B. Sharon Seller, RN, Raphael Gibney, Technician Referring MD:             Delphina Cahill, MD Medicines:                Meperidine 50 mg IV, Midazolam 7 mg IV Complications:            No immediate complications. Estimated Blood Loss:     Estimated blood loss was minimal. Procedure:                Pre-Anesthesia Assessment:                           - Prior to the procedure, a History and Physical                            was performed, and patient medications and                            allergies were reviewed. The patient's tolerance of                            previous anesthesia was also reviewed. The risks                            and benefits of the procedure and the sedation                            options and risks were discussed with the patient.                            All questions were answered, and informed consent                            was obtained. Prior Anticoagulants: The patient has                            taken no previous anticoagulant or antiplatelet  agents. ASA Grade Assessment: II - A patient with                            mild systemic disease. After reviewing the risks                            and benefits, the patient was deemed in                            satisfactory condition to undergo the procedure.                           After obtaining informed consent, the colonoscope         was passed under direct vision. Throughout the                            procedure, the patient's blood pressure, pulse, and                            oxygen saturations were monitored continuously. The                            PCF-[id number] ([id number]) scope was introduced through                            the anus and advanced to the the cecum, identified                            by appendiceal orifice and ileocecal valve. The                            colonoscopy was somewhat difficult due to a                            tortuous colon. The patient tolerated the procedure                            well. The quality of the bowel preparation was                            excellent. The ileocecal valve, appendiceal                            orifice, and rectum were photographed. Scope In: 9:31:34 AM Scope Out: 9:57:21 AM Scope Withdrawal Time: 0 hours 12 minutes 15 seconds  Total Procedure Duration: 0 hours 25 minutes 47 seconds  Findings:      The perianal and digital rectal examinations were normal.      Scattered diverticula were found in the entire colon.      A 4 mm polyp was found in the proximal sigmoid colon. The polyp was       sessile. The polyp was removed with a cold snare. Resection and       retrieval were complete.      The  retroflexed view of the distal rectum and anal verge was normal and       showed no anal or rectal abnormalities. Impression:               - Diverticulosis in the entire examined colon.                           - One 4 mm polyp in the proximal sigmoid colon,                            removed with a cold snare. Resected and retrieved. Moderate Sedation:      Moderate (conscious) sedation was administered by the endoscopy nurse       and supervised by the endoscopist. The following parameters were       monitored: oxygen saturation, heart rate, blood pressure, CO2       capnography and response to care. Total physician intraservice time  was       33 minutes. Recommendation:           - Patient has a contact number available for                            emergencies. The signs and symptoms of potential                            delayed complications were discussed with the                            patient. Return to normal activities tomorrow.                            Written discharge instructions were provided to the                            patient.                           - High fiber diet today.                           - Continue present medications.                           - No aspirin, ibuprofen, naproxen, or other                            non-steroidal anti-inflammatory drugs for 1 day.                           - Await pathology results.                           - Repeat colonoscopy is recommended. The                            colonoscopy date will be determined after pathology  results from today's exam become available for                            review. Procedure Code(s):        --- Professional ---                           [id number], Colonoscopy, flexible; with removal of                            tumor(s), polyp(s), or other lesion(s) by snare                            technique                           99153, Moderate sedation; each additional 15                            minutes intraservice time                           G0500, Moderate sedation services provided by the                            same physician or other qualified health care                            professional performing a gastrointestinal                            endoscopic service that sedation supports,                            requiring the presence of an independent trained                            observer to assist in the monitoring of the                            patient's level of consciousness and physiological                            status; initial 15 minutes of  intra-service time;                            patient age 5 years or older (additional time may                            be reported with [id number], as appropriate) Diagnosis Code(s):        --- Professional ---                           Z12.11, Encounter for screening for malignant  neoplasm of colon                           Z80.0, Family history of malignant neoplasm of                            digestive organs                           K63.5, Polyp of colon                           K57.30, Diverticulosis of large intestine without                            perforation or abscess without bleeding CPT copyright 2019 American [employer] Association. All rights reserved. The codes documented in this report are preliminary and upon coder review may  be revised to meet current compliance requirements. Hildred Laser, MD Hildred Laser, MD 11/16/2018 10:05:21 AM This report has been signed electronically. Number of Addenda: 0

## 2018-11-16 NOTE — H&P (Signed)
Teresa Howe is an 73 y.o. female.   Chief Complaint: Patient is here for colonoscopy. HPI: Patient 73 year old Caucasian female who is her first higher screening colonoscopy.  Last exam was in 2014 when she reported that her sister had surgery for colon carcinoma.  She states her sister has had adenomatous but her father had colon carcinoma around 9.  She denies abdominal pain change in bowel habits or rectal bleeding.   Past Medical History:  Diagnosis Date  . GERD (gastroesophageal reflux disease)   . Hypercholesteremia   . Hypertension     Past Surgical History:  Procedure Laterality Date  . ABDOMINAL HYSTERECTOMY    . COLONOSCOPY N/A 03/01/2013   Procedure: COLONOSCOPY;  Surgeon: Rogene Houston, MD;  Location: [facility];  Service: Endoscopy;  Laterality: N/A;  830-moved to 35 Ann to notify pt    Family History  Problem Relation Age of Onset  . Colon cancer Father    Social History:  reports that she has been smoking cigarettes. She has a 2.00 pack-year smoking history. She has never used smokeless tobacco. She reports previous alcohol use. She reports that she does not use drugs.  Allergies: No Known Allergies  Medications Prior to Admission  Medication Sig Dispense Refill  . alendronate (FOSAMAX) 70 MG tablet Take 70 mg by mouth every Tuesday.    Marland Kitchen atorvastatin (LIPITOR) 40 MG tablet Take 40 mg by mouth at bedtime.    . Calcium Carb-Cholecalciferol (CALCIUM 600+D3 PO) Take 1 tablet by mouth daily.    . cholecalciferol (QC VITAMIN D3) 10 MCG (400 UNIT) TABS tablet Take 400 Units by mouth daily.    Marland Kitchen ibuprofen (ADVIL) 200 MG tablet Take 200 mg by mouth every 8 (eight) hours as needed (pain.).    Marland Kitchen lisinopril (ZESTRIL) 20 MG tablet Take 20 mg by mouth daily.    . Multiple Vitamin (MULTIVITAMIN WITH MINERALS) TABS tablet Take 1 tablet by mouth daily.    . Omega-3 Fatty Acids (FISH OIL) 1000 MG CAPS Take 1,000 mg by mouth daily.    . pantoprazole (PROTONIX) 40 MG  tablet Take 40 mg by mouth daily.      No results found for this or any previous visit (from the past 48 hour(s)). No results found.  ROS  Blood pressure 115/74, pulse 79, temperature 98.2 F (36.8 C), temperature source Oral, resp. rate 18, height 4\' 11"  (1.499 m), weight 46.7 kg, SpO2 99 %. Physical Exam  Constitutional:  Well-developed thin Caucasian female in NAD.  HENT:  Mouth/Throat: Oropharynx is clear and moist.  Eyes: Conjunctivae are normal. No scleral icterus.  Neck: No thyromegaly present.  Cardiovascular: Normal rate, regular rhythm and normal heart sounds.  No murmur heard. Respiratory: Effort normal and breath sounds normal.  GI:  Abdomen is flat.  It is soft and nontender with organomegaly or masses.  Musculoskeletal:        General: No edema.  Lymphadenopathy:    She has no cervical adenopathy.  Neurological: She is alert.  Skin: Skin is warm and dry.     Assessment/Plan High risk screening colonoscopy.  Hildred Laser, MD 11/16/2018, 9:20 AM

## 2018-11-23 ENCOUNTER — Encounter (INDEPENDENT_AMBULATORY_CARE_PROVIDER_SITE_OTHER): Payer: Self-pay | Admitting: *Deleted

## 2018-11-23 DIAGNOSIS — I10 Essential (primary) hypertension: Secondary | ICD-10-CM | POA: Diagnosis not present

## 2018-11-23 DIAGNOSIS — E782 Mixed hyperlipidemia: Secondary | ICD-10-CM | POA: Diagnosis not present

## 2018-11-23 DIAGNOSIS — R7303 Prediabetes: Secondary | ICD-10-CM | POA: Diagnosis not present

## 2018-11-24 ENCOUNTER — Encounter (HOSPITAL_COMMUNITY): Payer: Self-pay | Admitting: Internal Medicine

## 2018-11-30 DIAGNOSIS — R69 Illness, unspecified: Secondary | ICD-10-CM | POA: Diagnosis not present

## 2018-11-30 DIAGNOSIS — D729 Disorder of white blood cells, unspecified: Secondary | ICD-10-CM | POA: Diagnosis not present

## 2018-11-30 DIAGNOSIS — R350 Frequency of micturition: Secondary | ICD-10-CM | POA: Diagnosis not present

## 2018-11-30 DIAGNOSIS — N183 Chronic kidney disease, stage 3 (moderate): Secondary | ICD-10-CM | POA: Diagnosis not present

## 2018-11-30 DIAGNOSIS — K219 Gastro-esophageal reflux disease without esophagitis: Secondary | ICD-10-CM | POA: Diagnosis not present

## 2018-11-30 DIAGNOSIS — I10 Essential (primary) hypertension: Secondary | ICD-10-CM | POA: Diagnosis not present

## 2018-11-30 DIAGNOSIS — R7303 Prediabetes: Secondary | ICD-10-CM | POA: Diagnosis not present

## 2018-11-30 DIAGNOSIS — R944 Abnormal results of kidney function studies: Secondary | ICD-10-CM | POA: Diagnosis not present

## 2018-11-30 DIAGNOSIS — E782 Mixed hyperlipidemia: Secondary | ICD-10-CM | POA: Diagnosis not present

## 2018-11-30 DIAGNOSIS — M810 Age-related osteoporosis without current pathological fracture: Secondary | ICD-10-CM | POA: Diagnosis not present

## 2018-12-27 DIAGNOSIS — Z85828 Personal history of other malignant neoplasm of skin: Secondary | ICD-10-CM | POA: Diagnosis not present

## 2018-12-27 DIAGNOSIS — L570 Actinic keratosis: Secondary | ICD-10-CM | POA: Diagnosis not present

## 2018-12-27 DIAGNOSIS — L819 Disorder of pigmentation, unspecified: Secondary | ICD-10-CM | POA: Diagnosis not present

## 2018-12-29 DIAGNOSIS — E782 Mixed hyperlipidemia: Secondary | ICD-10-CM | POA: Diagnosis not present

## 2018-12-29 DIAGNOSIS — Z0000 Encounter for general adult medical examination without abnormal findings: Secondary | ICD-10-CM | POA: Diagnosis not present

## 2018-12-29 DIAGNOSIS — M810 Age-related osteoporosis without current pathological fracture: Secondary | ICD-10-CM | POA: Diagnosis not present

## 2018-12-29 DIAGNOSIS — R7303 Prediabetes: Secondary | ICD-10-CM | POA: Diagnosis not present

## 2018-12-29 DIAGNOSIS — I10 Essential (primary) hypertension: Secondary | ICD-10-CM | POA: Diagnosis not present

## 2019-01-06 DIAGNOSIS — E782 Mixed hyperlipidemia: Secondary | ICD-10-CM | POA: Diagnosis not present

## 2019-01-06 DIAGNOSIS — I10 Essential (primary) hypertension: Secondary | ICD-10-CM | POA: Diagnosis not present

## 2019-01-06 DIAGNOSIS — Z0000 Encounter for general adult medical examination without abnormal findings: Secondary | ICD-10-CM | POA: Diagnosis not present

## 2019-01-06 DIAGNOSIS — R7303 Prediabetes: Secondary | ICD-10-CM | POA: Diagnosis not present

## 2019-01-06 DIAGNOSIS — M810 Age-related osteoporosis without current pathological fracture: Secondary | ICD-10-CM | POA: Diagnosis not present

## 2019-01-26 DIAGNOSIS — R69 Illness, unspecified: Secondary | ICD-10-CM | POA: Diagnosis not present

## 2019-02-14 DIAGNOSIS — R7303 Prediabetes: Secondary | ICD-10-CM | POA: Diagnosis not present

## 2019-02-14 DIAGNOSIS — E782 Mixed hyperlipidemia: Secondary | ICD-10-CM | POA: Diagnosis not present

## 2019-02-14 DIAGNOSIS — Z0000 Encounter for general adult medical examination without abnormal findings: Secondary | ICD-10-CM | POA: Diagnosis not present

## 2019-02-14 DIAGNOSIS — I10 Essential (primary) hypertension: Secondary | ICD-10-CM | POA: Diagnosis not present

## 2019-02-14 DIAGNOSIS — M810 Age-related osteoporosis without current pathological fracture: Secondary | ICD-10-CM | POA: Diagnosis not present

## 2019-03-06 DIAGNOSIS — N183 Chronic kidney disease, stage 3 unspecified: Secondary | ICD-10-CM | POA: Diagnosis not present

## 2019-03-06 DIAGNOSIS — I10 Essential (primary) hypertension: Secondary | ICD-10-CM | POA: Diagnosis not present

## 2019-03-06 DIAGNOSIS — E782 Mixed hyperlipidemia: Secondary | ICD-10-CM | POA: Diagnosis not present

## 2019-03-06 DIAGNOSIS — R7303 Prediabetes: Secondary | ICD-10-CM | POA: Diagnosis not present

## 2019-03-09 DIAGNOSIS — K219 Gastro-esophageal reflux disease without esophagitis: Secondary | ICD-10-CM | POA: Diagnosis not present

## 2019-03-09 DIAGNOSIS — R69 Illness, unspecified: Secondary | ICD-10-CM | POA: Diagnosis not present

## 2019-03-09 DIAGNOSIS — M810 Age-related osteoporosis without current pathological fracture: Secondary | ICD-10-CM | POA: Diagnosis not present

## 2019-03-09 DIAGNOSIS — I10 Essential (primary) hypertension: Secondary | ICD-10-CM | POA: Diagnosis not present

## 2019-03-09 DIAGNOSIS — R7303 Prediabetes: Secondary | ICD-10-CM | POA: Diagnosis not present

## 2019-03-09 DIAGNOSIS — R350 Frequency of micturition: Secondary | ICD-10-CM | POA: Diagnosis not present

## 2019-03-09 DIAGNOSIS — R944 Abnormal results of kidney function studies: Secondary | ICD-10-CM | POA: Diagnosis not present

## 2019-03-09 DIAGNOSIS — E782 Mixed hyperlipidemia: Secondary | ICD-10-CM | POA: Diagnosis not present

## 2019-03-09 DIAGNOSIS — M25551 Pain in right hip: Secondary | ICD-10-CM | POA: Diagnosis not present

## 2019-03-09 DIAGNOSIS — D729 Disorder of white blood cells, unspecified: Secondary | ICD-10-CM | POA: Diagnosis not present

## 2019-03-14 DIAGNOSIS — R7303 Prediabetes: Secondary | ICD-10-CM | POA: Diagnosis not present

## 2019-03-14 DIAGNOSIS — R944 Abnormal results of kidney function studies: Secondary | ICD-10-CM | POA: Diagnosis not present

## 2019-03-14 DIAGNOSIS — R0781 Pleurodynia: Secondary | ICD-10-CM | POA: Diagnosis not present

## 2019-03-14 DIAGNOSIS — M810 Age-related osteoporosis without current pathological fracture: Secondary | ICD-10-CM | POA: Diagnosis not present

## 2019-03-14 DIAGNOSIS — E782 Mixed hyperlipidemia: Secondary | ICD-10-CM | POA: Diagnosis not present

## 2019-03-14 DIAGNOSIS — I10 Essential (primary) hypertension: Secondary | ICD-10-CM | POA: Diagnosis not present

## 2019-04-10 DIAGNOSIS — R7303 Prediabetes: Secondary | ICD-10-CM | POA: Diagnosis not present

## 2019-04-10 DIAGNOSIS — M810 Age-related osteoporosis without current pathological fracture: Secondary | ICD-10-CM | POA: Diagnosis not present

## 2019-04-10 DIAGNOSIS — R0781 Pleurodynia: Secondary | ICD-10-CM | POA: Diagnosis not present

## 2019-04-10 DIAGNOSIS — R944 Abnormal results of kidney function studies: Secondary | ICD-10-CM | POA: Diagnosis not present

## 2019-04-10 DIAGNOSIS — E7849 Other hyperlipidemia: Secondary | ICD-10-CM | POA: Diagnosis not present

## 2019-04-10 DIAGNOSIS — I10 Essential (primary) hypertension: Secondary | ICD-10-CM | POA: Diagnosis not present

## 2019-04-24 DIAGNOSIS — R69 Illness, unspecified: Secondary | ICD-10-CM | POA: Diagnosis not present

## 2019-05-15 DIAGNOSIS — R7303 Prediabetes: Secondary | ICD-10-CM | POA: Diagnosis not present

## 2019-05-15 DIAGNOSIS — E782 Mixed hyperlipidemia: Secondary | ICD-10-CM | POA: Diagnosis not present

## 2019-05-15 DIAGNOSIS — M810 Age-related osteoporosis without current pathological fracture: Secondary | ICD-10-CM | POA: Diagnosis not present

## 2019-05-15 DIAGNOSIS — R944 Abnormal results of kidney function studies: Secondary | ICD-10-CM | POA: Diagnosis not present

## 2019-05-15 DIAGNOSIS — I10 Essential (primary) hypertension: Secondary | ICD-10-CM | POA: Diagnosis not present

## 2019-05-15 DIAGNOSIS — R0781 Pleurodynia: Secondary | ICD-10-CM | POA: Diagnosis not present

## 2019-06-26 DIAGNOSIS — L570 Actinic keratosis: Secondary | ICD-10-CM | POA: Diagnosis not present

## 2019-06-27 DIAGNOSIS — Z85828 Personal history of other malignant neoplasm of skin: Secondary | ICD-10-CM | POA: Diagnosis not present

## 2019-06-27 DIAGNOSIS — Z833 Family history of diabetes mellitus: Secondary | ICD-10-CM | POA: Diagnosis not present

## 2019-06-27 DIAGNOSIS — Z7983 Long term (current) use of bisphosphonates: Secondary | ICD-10-CM | POA: Diagnosis not present

## 2019-06-27 DIAGNOSIS — K219 Gastro-esophageal reflux disease without esophagitis: Secondary | ICD-10-CM | POA: Diagnosis not present

## 2019-06-27 DIAGNOSIS — Z008 Encounter for other general examination: Secondary | ICD-10-CM | POA: Diagnosis not present

## 2019-06-27 DIAGNOSIS — R32 Unspecified urinary incontinence: Secondary | ICD-10-CM | POA: Diagnosis not present

## 2019-06-27 DIAGNOSIS — Z87891 Personal history of nicotine dependence: Secondary | ICD-10-CM | POA: Diagnosis not present

## 2019-06-27 DIAGNOSIS — E785 Hyperlipidemia, unspecified: Secondary | ICD-10-CM | POA: Diagnosis not present

## 2019-06-27 DIAGNOSIS — M810 Age-related osteoporosis without current pathological fracture: Secondary | ICD-10-CM | POA: Diagnosis not present

## 2019-06-27 DIAGNOSIS — Z809 Family history of malignant neoplasm, unspecified: Secondary | ICD-10-CM | POA: Diagnosis not present

## 2019-06-27 DIAGNOSIS — I10 Essential (primary) hypertension: Secondary | ICD-10-CM | POA: Diagnosis not present

## 2019-07-04 DIAGNOSIS — E782 Mixed hyperlipidemia: Secondary | ICD-10-CM | POA: Diagnosis not present

## 2019-07-04 DIAGNOSIS — R0781 Pleurodynia: Secondary | ICD-10-CM | POA: Diagnosis not present

## 2019-07-04 DIAGNOSIS — I10 Essential (primary) hypertension: Secondary | ICD-10-CM | POA: Diagnosis not present

## 2019-07-04 DIAGNOSIS — R944 Abnormal results of kidney function studies: Secondary | ICD-10-CM | POA: Diagnosis not present

## 2019-07-04 DIAGNOSIS — M810 Age-related osteoporosis without current pathological fracture: Secondary | ICD-10-CM | POA: Diagnosis not present

## 2019-07-04 DIAGNOSIS — R7303 Prediabetes: Secondary | ICD-10-CM | POA: Diagnosis not present

## 2019-08-07 DIAGNOSIS — R0781 Pleurodynia: Secondary | ICD-10-CM | POA: Diagnosis not present

## 2019-08-07 DIAGNOSIS — R944 Abnormal results of kidney function studies: Secondary | ICD-10-CM | POA: Diagnosis not present

## 2019-08-07 DIAGNOSIS — R7303 Prediabetes: Secondary | ICD-10-CM | POA: Diagnosis not present

## 2019-08-07 DIAGNOSIS — E782 Mixed hyperlipidemia: Secondary | ICD-10-CM | POA: Diagnosis not present

## 2019-08-07 DIAGNOSIS — M810 Age-related osteoporosis without current pathological fracture: Secondary | ICD-10-CM | POA: Diagnosis not present

## 2019-08-07 DIAGNOSIS — I10 Essential (primary) hypertension: Secondary | ICD-10-CM | POA: Diagnosis not present

## 2019-09-06 DIAGNOSIS — R7303 Prediabetes: Secondary | ICD-10-CM | POA: Diagnosis not present

## 2019-09-06 DIAGNOSIS — I10 Essential (primary) hypertension: Secondary | ICD-10-CM | POA: Diagnosis not present

## 2019-09-06 DIAGNOSIS — R0781 Pleurodynia: Secondary | ICD-10-CM | POA: Diagnosis not present

## 2019-09-06 DIAGNOSIS — R944 Abnormal results of kidney function studies: Secondary | ICD-10-CM | POA: Diagnosis not present

## 2019-09-06 DIAGNOSIS — M810 Age-related osteoporosis without current pathological fracture: Secondary | ICD-10-CM | POA: Diagnosis not present

## 2019-09-06 DIAGNOSIS — E782 Mixed hyperlipidemia: Secondary | ICD-10-CM | POA: Diagnosis not present

## 2019-09-18 DIAGNOSIS — K219 Gastro-esophageal reflux disease without esophagitis: Secondary | ICD-10-CM | POA: Diagnosis not present

## 2019-09-18 DIAGNOSIS — J0100 Acute maxillary sinusitis, unspecified: Secondary | ICD-10-CM | POA: Diagnosis not present

## 2019-09-18 DIAGNOSIS — D729 Disorder of white blood cells, unspecified: Secondary | ICD-10-CM | POA: Diagnosis not present

## 2019-09-18 DIAGNOSIS — H9190 Unspecified hearing loss, unspecified ear: Secondary | ICD-10-CM | POA: Diagnosis not present

## 2019-09-18 DIAGNOSIS — E782 Mixed hyperlipidemia: Secondary | ICD-10-CM | POA: Diagnosis not present

## 2019-09-18 DIAGNOSIS — E039 Hypothyroidism, unspecified: Secondary | ICD-10-CM | POA: Diagnosis not present

## 2019-09-18 DIAGNOSIS — E7849 Other hyperlipidemia: Secondary | ICD-10-CM | POA: Diagnosis not present

## 2019-09-18 DIAGNOSIS — B349 Viral infection, unspecified: Secondary | ICD-10-CM | POA: Diagnosis not present

## 2019-09-18 DIAGNOSIS — R69 Illness, unspecified: Secondary | ICD-10-CM | POA: Diagnosis not present

## 2019-09-18 DIAGNOSIS — R419 Unspecified symptoms and signs involving cognitive functions and awareness: Secondary | ICD-10-CM | POA: Diagnosis not present

## 2019-09-18 DIAGNOSIS — L239 Allergic contact dermatitis, unspecified cause: Secondary | ICD-10-CM | POA: Diagnosis not present

## 2019-09-18 DIAGNOSIS — I10 Essential (primary) hypertension: Secondary | ICD-10-CM | POA: Diagnosis not present

## 2019-09-18 DIAGNOSIS — R7301 Impaired fasting glucose: Secondary | ICD-10-CM | POA: Diagnosis not present

## 2019-09-21 DIAGNOSIS — E782 Mixed hyperlipidemia: Secondary | ICD-10-CM | POA: Diagnosis not present

## 2019-09-21 DIAGNOSIS — R945 Abnormal results of liver function studies: Secondary | ICD-10-CM | POA: Diagnosis not present

## 2019-09-21 DIAGNOSIS — R7303 Prediabetes: Secondary | ICD-10-CM | POA: Diagnosis not present

## 2019-09-21 DIAGNOSIS — M25551 Pain in right hip: Secondary | ICD-10-CM | POA: Diagnosis not present

## 2019-09-21 DIAGNOSIS — N1831 Chronic kidney disease, stage 3a: Secondary | ICD-10-CM | POA: Diagnosis not present

## 2019-09-21 DIAGNOSIS — I129 Hypertensive chronic kidney disease with stage 1 through stage 4 chronic kidney disease, or unspecified chronic kidney disease: Secondary | ICD-10-CM | POA: Diagnosis not present

## 2019-09-21 DIAGNOSIS — R69 Illness, unspecified: Secondary | ICD-10-CM | POA: Diagnosis not present

## 2019-09-21 DIAGNOSIS — Z716 Tobacco abuse counseling: Secondary | ICD-10-CM | POA: Diagnosis not present

## 2019-09-21 DIAGNOSIS — K219 Gastro-esophageal reflux disease without esophagitis: Secondary | ICD-10-CM | POA: Diagnosis not present

## 2019-09-21 DIAGNOSIS — M810 Age-related osteoporosis without current pathological fracture: Secondary | ICD-10-CM | POA: Diagnosis not present

## 2019-09-25 ENCOUNTER — Other Ambulatory Visit (HOSPITAL_COMMUNITY): Payer: Self-pay | Admitting: Internal Medicine

## 2019-09-25 DIAGNOSIS — Z1231 Encounter for screening mammogram for malignant neoplasm of breast: Secondary | ICD-10-CM

## 2019-10-06 DIAGNOSIS — E782 Mixed hyperlipidemia: Secondary | ICD-10-CM | POA: Diagnosis not present

## 2019-10-06 DIAGNOSIS — Z0000 Encounter for general adult medical examination without abnormal findings: Secondary | ICD-10-CM | POA: Diagnosis not present

## 2019-10-06 DIAGNOSIS — R7303 Prediabetes: Secondary | ICD-10-CM | POA: Diagnosis not present

## 2019-10-06 DIAGNOSIS — M810 Age-related osteoporosis without current pathological fracture: Secondary | ICD-10-CM | POA: Diagnosis not present

## 2019-10-06 DIAGNOSIS — R0781 Pleurodynia: Secondary | ICD-10-CM | POA: Diagnosis not present

## 2019-10-06 DIAGNOSIS — I10 Essential (primary) hypertension: Secondary | ICD-10-CM | POA: Diagnosis not present

## 2019-10-06 DIAGNOSIS — Z681 Body mass index (BMI) 19 or less, adult: Secondary | ICD-10-CM | POA: Diagnosis not present

## 2019-10-20 ENCOUNTER — Other Ambulatory Visit: Payer: Self-pay

## 2019-10-20 ENCOUNTER — Ambulatory Visit (HOSPITAL_COMMUNITY)
Admission: RE | Admit: 2019-10-20 | Discharge: 2019-10-20 | Disposition: A | Discharge status: Home / Self Care | Payer: Medicare HMO | Source: Ambulatory Visit | Attending: Internal Medicine | Admitting: Internal Medicine

## 2019-10-20 DIAGNOSIS — Z1231 Encounter for screening mammogram for malignant neoplasm of breast: Secondary | ICD-10-CM | POA: Diagnosis not present

## 2019-11-13 DIAGNOSIS — R7303 Prediabetes: Secondary | ICD-10-CM | POA: Diagnosis not present

## 2019-11-13 DIAGNOSIS — Z681 Body mass index (BMI) 19 or less, adult: Secondary | ICD-10-CM | POA: Diagnosis not present

## 2019-11-13 DIAGNOSIS — Z0000 Encounter for general adult medical examination without abnormal findings: Secondary | ICD-10-CM | POA: Diagnosis not present

## 2019-11-13 DIAGNOSIS — E782 Mixed hyperlipidemia: Secondary | ICD-10-CM | POA: Diagnosis not present

## 2019-11-13 DIAGNOSIS — M810 Age-related osteoporosis without current pathological fracture: Secondary | ICD-10-CM | POA: Diagnosis not present

## 2019-11-13 DIAGNOSIS — I10 Essential (primary) hypertension: Secondary | ICD-10-CM | POA: Diagnosis not present

## 2019-11-13 DIAGNOSIS — R0781 Pleurodynia: Secondary | ICD-10-CM | POA: Diagnosis not present

## 2019-12-13 DIAGNOSIS — R0781 Pleurodynia: Secondary | ICD-10-CM | POA: Diagnosis not present

## 2019-12-13 DIAGNOSIS — Z681 Body mass index (BMI) 19 or less, adult: Secondary | ICD-10-CM | POA: Diagnosis not present

## 2019-12-13 DIAGNOSIS — M810 Age-related osteoporosis without current pathological fracture: Secondary | ICD-10-CM | POA: Diagnosis not present

## 2019-12-13 DIAGNOSIS — E782 Mixed hyperlipidemia: Secondary | ICD-10-CM | POA: Diagnosis not present

## 2019-12-13 DIAGNOSIS — I10 Essential (primary) hypertension: Secondary | ICD-10-CM | POA: Diagnosis not present

## 2019-12-13 DIAGNOSIS — R7303 Prediabetes: Secondary | ICD-10-CM | POA: Diagnosis not present

## 2020-01-01 DIAGNOSIS — D0462 Carcinoma in situ of skin of left upper limb, including shoulder: Secondary | ICD-10-CM | POA: Diagnosis not present

## 2020-01-01 DIAGNOSIS — L570 Actinic keratosis: Secondary | ICD-10-CM | POA: Diagnosis not present

## 2020-01-01 DIAGNOSIS — C44629 Squamous cell carcinoma of skin of left upper limb, including shoulder: Secondary | ICD-10-CM | POA: Diagnosis not present

## 2020-01-08 DIAGNOSIS — R0781 Pleurodynia: Secondary | ICD-10-CM | POA: Diagnosis not present

## 2020-01-08 DIAGNOSIS — Z681 Body mass index (BMI) 19 or less, adult: Secondary | ICD-10-CM | POA: Diagnosis not present

## 2020-01-08 DIAGNOSIS — I10 Essential (primary) hypertension: Secondary | ICD-10-CM | POA: Diagnosis not present

## 2020-01-08 DIAGNOSIS — R7303 Prediabetes: Secondary | ICD-10-CM | POA: Diagnosis not present

## 2020-01-08 DIAGNOSIS — E782 Mixed hyperlipidemia: Secondary | ICD-10-CM | POA: Diagnosis not present

## 2020-01-08 DIAGNOSIS — M810 Age-related osteoporosis without current pathological fracture: Secondary | ICD-10-CM | POA: Diagnosis not present

## 2020-01-11 DIAGNOSIS — C44629 Squamous cell carcinoma of skin of left upper limb, including shoulder: Secondary | ICD-10-CM | POA: Diagnosis not present

## 2020-02-05 DIAGNOSIS — Z681 Body mass index (BMI) 19 or less, adult: Secondary | ICD-10-CM | POA: Diagnosis not present

## 2020-02-05 DIAGNOSIS — R0781 Pleurodynia: Secondary | ICD-10-CM | POA: Diagnosis not present

## 2020-02-05 DIAGNOSIS — I10 Essential (primary) hypertension: Secondary | ICD-10-CM | POA: Diagnosis not present

## 2020-02-05 DIAGNOSIS — M810 Age-related osteoporosis without current pathological fracture: Secondary | ICD-10-CM | POA: Diagnosis not present

## 2020-02-05 DIAGNOSIS — Z0000 Encounter for general adult medical examination without abnormal findings: Secondary | ICD-10-CM | POA: Diagnosis not present

## 2020-02-05 DIAGNOSIS — E782 Mixed hyperlipidemia: Secondary | ICD-10-CM | POA: Diagnosis not present

## 2020-02-05 DIAGNOSIS — R7303 Prediabetes: Secondary | ICD-10-CM | POA: Diagnosis not present

## 2020-03-05 DIAGNOSIS — R69 Illness, unspecified: Secondary | ICD-10-CM | POA: Diagnosis not present

## 2020-03-07 DIAGNOSIS — R5381 Other malaise: Secondary | ICD-10-CM | POA: Diagnosis not present

## 2020-03-21 DIAGNOSIS — Z716 Tobacco abuse counseling: Secondary | ICD-10-CM | POA: Diagnosis not present

## 2020-03-21 DIAGNOSIS — Z681 Body mass index (BMI) 19 or less, adult: Secondary | ICD-10-CM | POA: Diagnosis not present

## 2020-03-21 DIAGNOSIS — M810 Age-related osteoporosis without current pathological fracture: Secondary | ICD-10-CM | POA: Diagnosis not present

## 2020-03-21 DIAGNOSIS — R7303 Prediabetes: Secondary | ICD-10-CM | POA: Diagnosis not present

## 2020-03-21 DIAGNOSIS — I10 Essential (primary) hypertension: Secondary | ICD-10-CM | POA: Diagnosis not present

## 2020-03-21 DIAGNOSIS — E782 Mixed hyperlipidemia: Secondary | ICD-10-CM | POA: Diagnosis not present

## 2020-03-21 DIAGNOSIS — M8580 Other specified disorders of bone density and structure, unspecified site: Secondary | ICD-10-CM | POA: Diagnosis not present

## 2020-03-21 DIAGNOSIS — R69 Illness, unspecified: Secondary | ICD-10-CM | POA: Diagnosis not present

## 2020-03-21 DIAGNOSIS — K219 Gastro-esophageal reflux disease without esophagitis: Secondary | ICD-10-CM | POA: Diagnosis not present

## 2020-03-21 DIAGNOSIS — Z0000 Encounter for general adult medical examination without abnormal findings: Secondary | ICD-10-CM | POA: Diagnosis not present

## 2020-04-05 DIAGNOSIS — E782 Mixed hyperlipidemia: Secondary | ICD-10-CM | POA: Diagnosis not present

## 2020-04-05 DIAGNOSIS — Z681 Body mass index (BMI) 19 or less, adult: Secondary | ICD-10-CM | POA: Diagnosis not present

## 2020-04-05 DIAGNOSIS — I10 Essential (primary) hypertension: Secondary | ICD-10-CM | POA: Diagnosis not present

## 2020-04-05 DIAGNOSIS — M810 Age-related osteoporosis without current pathological fracture: Secondary | ICD-10-CM | POA: Diagnosis not present

## 2020-04-05 DIAGNOSIS — R0781 Pleurodynia: Secondary | ICD-10-CM | POA: Diagnosis not present

## 2020-04-05 DIAGNOSIS — Z0000 Encounter for general adult medical examination without abnormal findings: Secondary | ICD-10-CM | POA: Diagnosis not present

## 2020-04-05 DIAGNOSIS — R7303 Prediabetes: Secondary | ICD-10-CM | POA: Diagnosis not present

## 2020-04-10 DIAGNOSIS — N1831 Chronic kidney disease, stage 3a: Secondary | ICD-10-CM | POA: Diagnosis not present

## 2020-04-10 DIAGNOSIS — I129 Hypertensive chronic kidney disease with stage 1 through stage 4 chronic kidney disease, or unspecified chronic kidney disease: Secondary | ICD-10-CM | POA: Diagnosis not present

## 2020-04-10 DIAGNOSIS — M25551 Pain in right hip: Secondary | ICD-10-CM | POA: Diagnosis not present

## 2020-04-10 DIAGNOSIS — R7303 Prediabetes: Secondary | ICD-10-CM | POA: Diagnosis not present

## 2020-04-10 DIAGNOSIS — E782 Mixed hyperlipidemia: Secondary | ICD-10-CM | POA: Diagnosis not present

## 2020-04-10 DIAGNOSIS — R945 Abnormal results of liver function studies: Secondary | ICD-10-CM | POA: Diagnosis not present

## 2020-04-10 DIAGNOSIS — M810 Age-related osteoporosis without current pathological fracture: Secondary | ICD-10-CM | POA: Diagnosis not present

## 2020-04-10 DIAGNOSIS — K219 Gastro-esophageal reflux disease without esophagitis: Secondary | ICD-10-CM | POA: Diagnosis not present

## 2020-05-04 DIAGNOSIS — R7303 Prediabetes: Secondary | ICD-10-CM | POA: Diagnosis not present

## 2020-05-04 DIAGNOSIS — I10 Essential (primary) hypertension: Secondary | ICD-10-CM | POA: Diagnosis not present

## 2020-05-04 DIAGNOSIS — E782 Mixed hyperlipidemia: Secondary | ICD-10-CM | POA: Diagnosis not present

## 2020-05-04 DIAGNOSIS — M810 Age-related osteoporosis without current pathological fracture: Secondary | ICD-10-CM | POA: Diagnosis not present

## 2020-05-04 DIAGNOSIS — R0781 Pleurodynia: Secondary | ICD-10-CM | POA: Diagnosis not present

## 2020-05-04 DIAGNOSIS — R944 Abnormal results of kidney function studies: Secondary | ICD-10-CM | POA: Diagnosis not present

## 2020-05-28 DIAGNOSIS — I10 Essential (primary) hypertension: Secondary | ICD-10-CM | POA: Diagnosis not present

## 2020-05-28 DIAGNOSIS — E785 Hyperlipidemia, unspecified: Secondary | ICD-10-CM | POA: Diagnosis not present

## 2020-05-28 DIAGNOSIS — K219 Gastro-esophageal reflux disease without esophagitis: Secondary | ICD-10-CM | POA: Diagnosis not present

## 2020-05-28 DIAGNOSIS — R69 Illness, unspecified: Secondary | ICD-10-CM | POA: Diagnosis not present

## 2020-05-28 DIAGNOSIS — Z833 Family history of diabetes mellitus: Secondary | ICD-10-CM | POA: Diagnosis not present

## 2020-05-28 DIAGNOSIS — M810 Age-related osteoporosis without current pathological fracture: Secondary | ICD-10-CM | POA: Diagnosis not present

## 2020-05-28 DIAGNOSIS — Z008 Encounter for other general examination: Secondary | ICD-10-CM | POA: Diagnosis not present

## 2020-05-28 DIAGNOSIS — Z7983 Long term (current) use of bisphosphonates: Secondary | ICD-10-CM | POA: Diagnosis not present

## 2020-06-03 DIAGNOSIS — E782 Mixed hyperlipidemia: Secondary | ICD-10-CM | POA: Diagnosis not present

## 2020-06-03 DIAGNOSIS — I10 Essential (primary) hypertension: Secondary | ICD-10-CM | POA: Diagnosis not present

## 2020-06-03 DIAGNOSIS — R944 Abnormal results of kidney function studies: Secondary | ICD-10-CM | POA: Diagnosis not present

## 2020-06-03 DIAGNOSIS — R7303 Prediabetes: Secondary | ICD-10-CM | POA: Diagnosis not present

## 2020-06-03 DIAGNOSIS — R0781 Pleurodynia: Secondary | ICD-10-CM | POA: Diagnosis not present

## 2020-06-25 DIAGNOSIS — L988 Other specified disorders of the skin and subcutaneous tissue: Secondary | ICD-10-CM | POA: Diagnosis not present

## 2020-06-25 DIAGNOSIS — L570 Actinic keratosis: Secondary | ICD-10-CM | POA: Diagnosis not present

## 2020-06-25 DIAGNOSIS — C44629 Squamous cell carcinoma of skin of left upper limb, including shoulder: Secondary | ICD-10-CM | POA: Diagnosis not present

## 2020-08-04 DIAGNOSIS — R350 Frequency of micturition: Secondary | ICD-10-CM | POA: Diagnosis not present

## 2020-08-04 DIAGNOSIS — I10 Essential (primary) hypertension: Secondary | ICD-10-CM | POA: Diagnosis not present

## 2020-09-24 ENCOUNTER — Other Ambulatory Visit (HOSPITAL_COMMUNITY): Payer: Self-pay | Admitting: Internal Medicine

## 2020-09-24 DIAGNOSIS — Z1231 Encounter for screening mammogram for malignant neoplasm of breast: Secondary | ICD-10-CM

## 2020-09-27 DIAGNOSIS — R350 Frequency of micturition: Secondary | ICD-10-CM | POA: Diagnosis not present

## 2020-09-27 DIAGNOSIS — I10 Essential (primary) hypertension: Secondary | ICD-10-CM | POA: Diagnosis not present

## 2020-10-09 DIAGNOSIS — R7303 Prediabetes: Secondary | ICD-10-CM | POA: Diagnosis not present

## 2020-10-09 DIAGNOSIS — I10 Essential (primary) hypertension: Secondary | ICD-10-CM | POA: Diagnosis not present

## 2020-10-16 DIAGNOSIS — I10 Essential (primary) hypertension: Secondary | ICD-10-CM | POA: Diagnosis not present

## 2020-10-16 DIAGNOSIS — N1831 Chronic kidney disease, stage 3a: Secondary | ICD-10-CM | POA: Diagnosis not present

## 2020-10-16 DIAGNOSIS — I129 Hypertensive chronic kidney disease with stage 1 through stage 4 chronic kidney disease, or unspecified chronic kidney disease: Secondary | ICD-10-CM | POA: Diagnosis not present

## 2020-10-16 DIAGNOSIS — E782 Mixed hyperlipidemia: Secondary | ICD-10-CM | POA: Diagnosis not present

## 2020-10-16 DIAGNOSIS — M810 Age-related osteoporosis without current pathological fracture: Secondary | ICD-10-CM | POA: Diagnosis not present

## 2020-10-16 DIAGNOSIS — R7303 Prediabetes: Secondary | ICD-10-CM | POA: Diagnosis not present

## 2020-10-16 DIAGNOSIS — M25551 Pain in right hip: Secondary | ICD-10-CM | POA: Diagnosis not present

## 2020-10-16 DIAGNOSIS — R69 Illness, unspecified: Secondary | ICD-10-CM | POA: Diagnosis not present

## 2020-10-16 DIAGNOSIS — K219 Gastro-esophageal reflux disease without esophagitis: Secondary | ICD-10-CM | POA: Diagnosis not present

## 2020-10-23 ENCOUNTER — Other Ambulatory Visit: Payer: Self-pay

## 2020-10-23 ENCOUNTER — Ambulatory Visit (HOSPITAL_COMMUNITY)
Admission: RE | Admit: 2020-10-23 | Discharge: 2020-10-23 | Disposition: A | Discharge status: Home / Self Care | Payer: Medicare HMO | Source: Ambulatory Visit | Attending: Internal Medicine | Admitting: Internal Medicine

## 2020-10-23 DIAGNOSIS — Z1231 Encounter for screening mammogram for malignant neoplasm of breast: Secondary | ICD-10-CM | POA: Diagnosis not present

## 2020-10-29 ENCOUNTER — Other Ambulatory Visit (HOSPITAL_COMMUNITY): Payer: Self-pay | Admitting: Internal Medicine

## 2020-10-29 DIAGNOSIS — R928 Other abnormal and inconclusive findings on diagnostic imaging of breast: Secondary | ICD-10-CM

## 2020-11-01 ENCOUNTER — Other Ambulatory Visit: Payer: Self-pay

## 2020-11-01 ENCOUNTER — Ambulatory Visit (HOSPITAL_COMMUNITY)
Admission: RE | Admit: 2020-11-01 | Discharge: 2020-11-01 | Disposition: A | Discharge status: Home / Self Care | Payer: Medicare HMO | Source: Ambulatory Visit | Attending: Internal Medicine | Admitting: Internal Medicine

## 2020-11-01 DIAGNOSIS — R928 Other abnormal and inconclusive findings on diagnostic imaging of breast: Secondary | ICD-10-CM

## 2020-11-01 DIAGNOSIS — R922 Inconclusive mammogram: Secondary | ICD-10-CM | POA: Diagnosis not present

## 2020-12-02 DIAGNOSIS — H31092 Other chorioretinal scars, left eye: Secondary | ICD-10-CM | POA: Diagnosis not present

## 2020-12-02 DIAGNOSIS — H524 Presbyopia: Secondary | ICD-10-CM | POA: Diagnosis not present

## 2020-12-04 DIAGNOSIS — I10 Essential (primary) hypertension: Secondary | ICD-10-CM | POA: Diagnosis not present

## 2020-12-04 DIAGNOSIS — R350 Frequency of micturition: Secondary | ICD-10-CM | POA: Diagnosis not present

## 2020-12-24 DIAGNOSIS — Z0001 Encounter for general adult medical examination with abnormal findings: Secondary | ICD-10-CM | POA: Diagnosis not present

## 2020-12-25 DIAGNOSIS — C44319 Basal cell carcinoma of skin of other parts of face: Secondary | ICD-10-CM | POA: Diagnosis not present

## 2020-12-25 DIAGNOSIS — L570 Actinic keratosis: Secondary | ICD-10-CM | POA: Diagnosis not present

## 2020-12-25 DIAGNOSIS — D485 Neoplasm of uncertain behavior of skin: Secondary | ICD-10-CM | POA: Diagnosis not present

## 2020-12-25 DIAGNOSIS — N390 Urinary tract infection, site not specified: Secondary | ICD-10-CM | POA: Diagnosis not present

## 2021-01-03 DIAGNOSIS — I10 Essential (primary) hypertension: Secondary | ICD-10-CM | POA: Diagnosis not present

## 2021-01-03 DIAGNOSIS — R350 Frequency of micturition: Secondary | ICD-10-CM | POA: Diagnosis not present

## 2021-01-09 DIAGNOSIS — C44319 Basal cell carcinoma of skin of other parts of face: Secondary | ICD-10-CM | POA: Diagnosis not present

## 2021-02-03 DIAGNOSIS — R350 Frequency of micturition: Secondary | ICD-10-CM | POA: Diagnosis not present

## 2021-02-03 DIAGNOSIS — I10 Essential (primary) hypertension: Secondary | ICD-10-CM | POA: Diagnosis not present

## 2021-04-17 DIAGNOSIS — R7303 Prediabetes: Secondary | ICD-10-CM | POA: Diagnosis not present

## 2021-04-17 DIAGNOSIS — E782 Mixed hyperlipidemia: Secondary | ICD-10-CM | POA: Diagnosis not present

## 2021-04-22 ENCOUNTER — Other Ambulatory Visit (HOSPITAL_COMMUNITY): Payer: Self-pay | Admitting: Family Medicine

## 2021-04-22 DIAGNOSIS — M25551 Pain in right hip: Secondary | ICD-10-CM | POA: Diagnosis not present

## 2021-04-22 DIAGNOSIS — I129 Hypertensive chronic kidney disease with stage 1 through stage 4 chronic kidney disease, or unspecified chronic kidney disease: Secondary | ICD-10-CM | POA: Diagnosis not present

## 2021-04-22 DIAGNOSIS — M810 Age-related osteoporosis without current pathological fracture: Secondary | ICD-10-CM | POA: Diagnosis not present

## 2021-04-22 DIAGNOSIS — N1831 Chronic kidney disease, stage 3a: Secondary | ICD-10-CM | POA: Diagnosis not present

## 2021-04-22 DIAGNOSIS — E782 Mixed hyperlipidemia: Secondary | ICD-10-CM | POA: Diagnosis not present

## 2021-04-22 DIAGNOSIS — K219 Gastro-esophageal reflux disease without esophagitis: Secondary | ICD-10-CM | POA: Diagnosis not present

## 2021-04-22 DIAGNOSIS — R7303 Prediabetes: Secondary | ICD-10-CM | POA: Diagnosis not present

## 2021-04-22 DIAGNOSIS — R69 Illness, unspecified: Secondary | ICD-10-CM | POA: Diagnosis not present

## 2021-05-06 DIAGNOSIS — I10 Essential (primary) hypertension: Secondary | ICD-10-CM | POA: Diagnosis not present

## 2021-05-06 DIAGNOSIS — E782 Mixed hyperlipidemia: Secondary | ICD-10-CM | POA: Diagnosis not present

## 2021-05-15 DIAGNOSIS — H01001 Unspecified blepharitis right upper eyelid: Secondary | ICD-10-CM | POA: Diagnosis not present

## 2021-05-15 DIAGNOSIS — H01002 Unspecified blepharitis right lower eyelid: Secondary | ICD-10-CM | POA: Diagnosis not present

## 2021-05-15 DIAGNOSIS — H2513 Age-related nuclear cataract, bilateral: Secondary | ICD-10-CM | POA: Diagnosis not present

## 2021-05-15 DIAGNOSIS — H01004 Unspecified blepharitis left upper eyelid: Secondary | ICD-10-CM | POA: Diagnosis not present

## 2021-05-22 DIAGNOSIS — H2511 Age-related nuclear cataract, right eye: Secondary | ICD-10-CM | POA: Diagnosis not present

## 2021-05-26 ENCOUNTER — Encounter (HOSPITAL_COMMUNITY): Payer: Self-pay

## 2021-05-26 ENCOUNTER — Encounter (HOSPITAL_COMMUNITY)
Admission: RE | Admit: 2021-05-26 | Discharge: 2021-05-26 | Disposition: A | Discharge status: Home / Self Care | Payer: Medicare HMO | Source: Ambulatory Visit | Attending: Ophthalmology | Admitting: Ophthalmology

## 2021-05-30 NOTE — H&P (Signed)
Surgical History & Physical  Patient Name: Teresa Howe DOB: [birth year]  Surgery: Cataract extraction with intraocular lens implant phacoemulsification; Right Eye  Surgeon: Baruch Goldmann MD Surgery Date:  06-02-21 Pre-Op Date:  05-15-21  HPI: A 56 Yr. old female patient is here for a cataract evaluation of both eyes, referred from Dr. Rosana Hoes. Since the last visit, the affected area is worsening. The patient's vision is blurry. The complaint is associated with light sensitivity. Patient expresses difficulty reading small print on medicine bottles or books and driving at night due to halos/glare. This is negatively affecting the patient's quality of life and the patient is unable to function adequately in life with the current level of vision. HPI was performed by Baruch Goldmann .  Medical History: Macula Degeneration Glaucoma High Blood Pressure LDL  Review of Systems Negative Allergic/Immunologic Negative Cardiovascular Negative Constitutional Negative Ear, Nose, Mouth & Throat Negative Endocrine Negative Eyes Negative Gastrointestinal Negative Genitourinary Negative Hemotologic/Lymphatic Negative Integumentary Negative Musculoskeletal Negative Neurological Negative Psychiatry Negative Respiratory  Social   Smoker,   Medication Lisinopril, Atorvastatin, Alendronate, Vitamin D3, Fish Oil,   Sx/Procedures Skin cancer removal,   Drug Allergies   NKDA  History & Physical: Heent: Cataract, Right Eye NECK: supple without bruits LUNGS: lungs clear to auscultation CV: regular rate and rhythm Abdomen: soft and non-tender  Impression & Plan: Assessment: 1.  NUCLEAR SCLEROSIS AGE RELATED; Both Eyes (H25.13) 2.  BLEPHARITIS; Right Upper Lid, Right Lower Lid, Left Upper Lid, Left Lower Lid (H01.001, H01.002,H01.004,H01.005) 3.  ASTIGMATISM, REGULAR; Both Eyes (H52.223)  Plan: 1.  Cataract accounts for the patient's decreased vision. This visual impairment is not  correctable with a tolerable change in glasses or contact lenses. Cataract surgery with an implantation of a new lens should significantly improve the visual and functional status of the patient. Discussed all risks, benefits, alternatives, and potential complications. Discussed the procedures and recovery. Patient desires to have surgery. A-scan ordered and performed today for intra-ocular lens calculations. The surgery will be performed in order to improve vision for driving, reading, and for eye examinations. Recommend phacoemulsification with intra-ocular lens. Recommend Dextenza for post-operative pain and inflammation. Right Eye worse according to patient - first. Dilates poorly - shugarcaine by protocol. Malyugin Ring. Omidira.  2.  recommend regular lid cleaning.  3.  Much worse OS - consider Toric Lens left eye only.

## 2021-06-02 ENCOUNTER — Ambulatory Visit (HOSPITAL_BASED_OUTPATIENT_CLINIC_OR_DEPARTMENT_OTHER): Payer: Medicare HMO | Admitting: Anesthesiology

## 2021-06-02 ENCOUNTER — Encounter (HOSPITAL_COMMUNITY)
Admission: RE | Disposition: A | Discharge status: Home / Self Care | Payer: Self-pay | Source: Ambulatory Visit | Attending: Ophthalmology

## 2021-06-02 ENCOUNTER — Ambulatory Visit (HOSPITAL_COMMUNITY)
Admission: RE | Admit: 2021-06-02 | Discharge: 2021-06-02 | Disposition: A | Discharge status: Home / Self Care | Payer: Medicare HMO | Source: Ambulatory Visit | Attending: Ophthalmology | Admitting: Ophthalmology

## 2021-06-02 ENCOUNTER — Ambulatory Visit (HOSPITAL_COMMUNITY): Payer: Medicare HMO | Admitting: Anesthesiology

## 2021-06-02 ENCOUNTER — Encounter (HOSPITAL_COMMUNITY): Payer: Self-pay | Admitting: Ophthalmology

## 2021-06-02 DIAGNOSIS — K219 Gastro-esophageal reflux disease without esophagitis: Secondary | ICD-10-CM | POA: Diagnosis not present

## 2021-06-02 DIAGNOSIS — F17200 Nicotine dependence, unspecified, uncomplicated: Secondary | ICD-10-CM | POA: Insufficient documentation

## 2021-06-02 DIAGNOSIS — H0100B Unspecified blepharitis left eye, upper and lower eyelids: Secondary | ICD-10-CM | POA: Diagnosis not present

## 2021-06-02 DIAGNOSIS — H2511 Age-related nuclear cataract, right eye: Secondary | ICD-10-CM

## 2021-06-02 DIAGNOSIS — H52223 Regular astigmatism, bilateral: Secondary | ICD-10-CM | POA: Insufficient documentation

## 2021-06-02 DIAGNOSIS — I10 Essential (primary) hypertension: Secondary | ICD-10-CM | POA: Insufficient documentation

## 2021-06-02 DIAGNOSIS — R69 Illness, unspecified: Secondary | ICD-10-CM | POA: Diagnosis not present

## 2021-06-02 DIAGNOSIS — H0100A Unspecified blepharitis right eye, upper and lower eyelids: Secondary | ICD-10-CM | POA: Diagnosis not present

## 2021-06-02 HISTORY — PX: CATARACT EXTRACTION W/PHACO: SHX586

## 2021-06-02 SURGERY — PHACOEMULSIFICATION, CATARACT, WITH IOL INSERTION
Anesthesia: Monitor Anesthesia Care | Site: Eye | Laterality: Right

## 2021-06-02 MED ORDER — EPINEPHRINE PF 1 MG/ML IJ SOLN
INTRAOCULAR | Status: DC | PRN
  Administered 2021-06-02: 500 mL

## 2021-06-02 MED ORDER — SODIUM HYALURONATE 23MG/ML IO SOSY
PREFILLED_SYRINGE | INTRAOCULAR | Status: DC | PRN
  Administered 2021-06-02: 0.6 mL via INTRAOCULAR

## 2021-06-02 MED ORDER — NEOMYCIN-POLYMYXIN-DEXAMETH 3.5-10000-0.1 OP SUSP
OPHTHALMIC | Status: DC | PRN
  Administered 2021-06-02: 1 [drp] via OPHTHALMIC

## 2021-06-02 MED ORDER — STERILE WATER FOR IRRIGATION IR SOLN
Status: DC | PRN
Start: 2021-06-02 — End: 2021-06-02
  Administered 2021-06-02: 250 mL

## 2021-06-02 MED ORDER — LIDOCAINE HCL (PF) 1 % IJ SOLN
INTRAOCULAR | Status: DC | PRN
  Administered 2021-06-02: 1 mL via OPHTHALMIC

## 2021-06-02 MED ORDER — TETRACAINE HCL 0.5 % OP SOLN
1.0000 [drp] | OPHTHALMIC | Status: AC | PRN
  Administered 2021-06-02 (×3): 1 [drp] via OPHTHALMIC

## 2021-06-02 MED ORDER — POVIDONE-IODINE 5 % OP SOLN
OPHTHALMIC | Status: DC | PRN
  Administered 2021-06-02: 1 via OPHTHALMIC

## 2021-06-02 MED ORDER — SODIUM HYALURONATE 10 MG/ML IO SOLUTION
PREFILLED_SYRINGE | INTRAOCULAR | Status: DC | PRN
  Administered 2021-06-02: 0.85 mL via INTRAOCULAR

## 2021-06-02 MED ORDER — BSS IO SOLN
INTRAOCULAR | Status: DC | PRN
  Administered 2021-06-02: 15 mL via INTRAOCULAR

## 2021-06-02 MED ORDER — LIDOCAINE HCL 3.5 % OP GEL
1.0000 "application " | Freq: Once | OPHTHALMIC | Status: AC
  Administered 2021-06-02: 1 via OPHTHALMIC

## 2021-06-02 MED ORDER — PHENYLEPHRINE HCL 2.5 % OP SOLN
1.0000 [drp] | OPHTHALMIC | Status: AC | PRN
  Administered 2021-06-02 (×3): 1 [drp] via OPHTHALMIC

## 2021-06-02 MED ORDER — PHENYLEPHRINE-KETOROLAC 1-0.3 % IO SOLN
INTRAOCULAR | Status: AC
  Filled 2021-06-02: qty 4

## 2021-06-02 MED ORDER — EPINEPHRINE PF 1 MG/ML IJ SOLN
INTRAMUSCULAR | Status: AC
  Filled 2021-06-02: qty 1

## 2021-06-02 MED ORDER — TROPICAMIDE 1 % OP SOLN
1.0000 [drp] | OPHTHALMIC | Status: AC | PRN
  Administered 2021-06-02 (×3): 1 [drp] via OPHTHALMIC
  Filled 2021-06-02: qty 2

## 2021-06-02 SURGICAL SUPPLY — 16 items
CATARACT SUITE SIGHTPATH (MISCELLANEOUS) ×2 IMPLANT
CLOTH BEACON ORANGE TIMEOUT ST (SAFETY) ×2 IMPLANT
EYE SHIELD UNIVERSAL CLEAR (GAUZE/BANDAGES/DRESSINGS) ×1 IMPLANT
FEE CATARACT SUITE SIGHTPATH (MISCELLANEOUS) ×1 IMPLANT
GLOVE SURG UNDER POLY LF SZ6.5 (GLOVE) ×1 IMPLANT
GLOVE SURG UNDER POLY LF SZ7 (GLOVE) ×1 IMPLANT
LENS IOL RAYNER 21.0 (Intraocular Lens) ×2 IMPLANT
LENS IOL RAYONE EMV 21.0 (Intraocular Lens) IMPLANT
NDL HYPO 18GX1.5 BLUNT FILL (NEEDLE) ×1 IMPLANT
NEEDLE HYPO 18GX1.5 BLUNT FILL (NEEDLE) ×2 IMPLANT
PAD ARMBOARD 7.5X6 YLW CONV (MISCELLANEOUS) ×2 IMPLANT
RING MALYGIN 7.0 (MISCELLANEOUS) IMPLANT
SYR TB 1ML LL NO SAFETY (SYRINGE) ×2 IMPLANT
TAPE SURG TRANSPORE 1 IN (GAUZE/BANDAGES/DRESSINGS) IMPLANT
TAPE SURGICAL TRANSPORE 1 IN (GAUZE/BANDAGES/DRESSINGS) ×2
WATER STERILE IRR 250ML POUR (IV SOLUTION) ×2 IMPLANT

## 2021-06-02 NOTE — Anesthesia Procedure Notes (Signed)
Procedure Name: MAC Date/Time: 06/02/2021 9:00 AM Performed by: Orlie Dakin, CRNA Pre-anesthesia Checklist: Patient identified, Emergency Drugs available, Suction available and Patient being monitored Patient Re-evaluated:Patient Re-evaluated prior to induction Oxygen Delivery Method: Nasal cannula Placement Confirmation: positive ETCO2

## 2021-06-02 NOTE — Anesthesia Preprocedure Evaluation (Signed)
Anesthesia Evaluation  Patient identified by MRN, date of birth, ID band Patient awake    Reviewed: Allergy & Precautions, NPO status , Patient's Chart, lab work & pertinent test results  Airway Mallampati: II  TM Distance: >3 FB Neck ROM: Full    Dental  (+) Lower Dentures, Upper Dentures   Pulmonary Current Smoker and Patient abstained from smoking.,    Pulmonary exam normal breath sounds clear to auscultation       Cardiovascular Exercise Tolerance: Good hypertension, Pt. on medications Normal cardiovascular exam Rhythm:Regular Rate:Normal     Neuro/Psych negative neurological ROS  negative psych ROS   GI/Hepatic Neg liver ROS, GERD  Medicated and Controlled,  Endo/Other  negative endocrine ROS  Renal/GU negative Renal ROS     Musculoskeletal negative musculoskeletal ROS (+)   Abdominal   Peds  Hematology negative hematology ROS (+)   Anesthesia Other Findings   Reproductive/Obstetrics negative OB ROS                             Anesthesia Physical Anesthesia Plan  ASA: 2  Anesthesia Plan: MAC   Post-op Pain Management: Minimal or no pain anticipated   Induction: Intravenous  PONV Risk Score and Plan:   Airway Management Planned: Natural Airway and Nasal Cannula  Additional Equipment:   Intra-op Plan:   Post-operative Plan:   Informed Consent: I have reviewed the patients History and Physical, chart, labs and discussed the procedure including the risks, benefits and alternatives for the proposed anesthesia with the patient or authorized representative who has indicated his/her understanding and acceptance.     Dental advisory given  Plan Discussed with: CRNA and Surgeon  Anesthesia Plan Comments:         Anesthesia Quick Evaluation

## 2021-06-02 NOTE — Op Note (Signed)
Date of procedure: 06/02/21  Pre-operative diagnosis: Visually significant age-related nuclear cataract, Right Eye (H25.11)  Post-operative diagnosis: Visually significant age-related nuclear cataract, Right Eye  Procedure: Removal of cataract via phacoemulsification and insertion of intra-ocular lens Rayner [id number] +21.0D into the capsular bag of the Right Eye  Attending surgeon: Gerda Diss. Nianna Igo, MD, MA  Anesthesia: MAC, Topical Akten  Complications: None  Estimated Blood Loss: <52m (minimal)  Specimens: None  Implants: As above  Indications:  Visually significant age-related cataract, Right Eye  Procedure:  The patient was seen and identified in the pre-operative area. The operative eye was identified and dilated.  The operative eye was marked.  Topical anesthesia was administered to the operative eye.     The patient was then to the operative suite and placed in the supine position.  A timeout was performed confirming the patient, procedure to be performed, and all other relevant information.   The patient's face was prepped and draped in the usual fashion for intra-ocular surgery.  A lid speculum was placed into the operative eye and the surgical microscope moved into place and focused.  A superotemporal paracentesis was created using a 20 gauge paracentesis blade.  Shugarcaine was injected into the anterior chamber.  Viscoelastic was injected into the anterior chamber.  A temporal clear-corneal main wound incision was created using a 2.442mmicrokeratome.  A continuous curvilinear capsulorrhexis was initiated using an irrigating cystitome and completed using capsulorrhexis forceps.  Hydrodissection and hydrodeliniation were performed.  Viscoelastic was injected into the anterior chamber.  A phacoemulsification handpiece and a chopper as a second instrument were used to remove the nucleus and epinucleus. The irrigation/aspiration handpiece was used to remove any remaining cortical  material.   The capsular bag was reinflated with viscoelastic, checked, and found to be intact.  The intraocular lens was inserted into the capsular bag.  The irrigation/aspiration handpiece was used to remove any remaining viscoelastic.  The clear corneal wound and paracentesis wounds were then hydrated and checked with Weck-Cels to be watertight.  The lid-speculum and drape was removed, and the patient's face was cleaned with a wet and dry 4x4.  Maxitrol was instilled in the eye. A clear shield was taped over the eye. The patient was taken to the post-operative care unit in good condition, having tolerated the procedure well.  Post-Op Instructions: [facility] and will receive all other orders and instructions.

## 2021-06-02 NOTE — Discharge Instructions (Addendum)
Please discharge patient when stable, will follow up today with Dr. Wrzosek at the [facility] office immediately following discharge.  Leave shield in place until visit.  All paperwork with discharge instructions will be given at the office.  [facility] Address:  [address]  [place], [place] [address]  

## 2021-06-02 NOTE — Transfer of Care (Signed)
Immediate Anesthesia Transfer of Care Note  Patient: Teresa Howe  Procedure(s) Performed: CATARACT EXTRACTION PHACO AND INTRAOCULAR LENS PLACEMENT (IOC) (Right: Eye)  Patient Location: Short Stay  Anesthesia Type:MAC  Level of Consciousness: awake, alert  and oriented  Airway & Oxygen Therapy: Patient Spontanous Breathing  Post-op Assessment: Report given to RN, Post -op Vital signs reviewed and stable and Patient moving all extremities X 4  Post vital signs: Reviewed and stable  Last Vitals:  Vitals Value Taken Time  BP 132/64 06/02/21 0916  Temp    Pulse 67 06/02/21 0916  Resp 20 06/02/21 0916  SpO2 99 % 06/02/21 0916    Last Pain:  Vitals:   06/02/21 0815  TempSrc: Oral  PainSc: 0-No pain      Patients Stated Pain Goal: 6 (00/17/49 4496)  Complications: No notable events documented.

## 2021-06-02 NOTE — Anesthesia Postprocedure Evaluation (Signed)
Anesthesia Post Note  Patient: Teresa Howe  Procedure(s) Performed: CATARACT EXTRACTION PHACO AND INTRAOCULAR LENS PLACEMENT (IOC) (Right: Eye)  Patient location during evaluation: Phase II Anesthesia Type: MAC Level of consciousness: awake and alert and oriented Pain management: pain level controlled Vital Signs Assessment: post-procedure vital signs reviewed and stable Respiratory status: spontaneous breathing, nonlabored ventilation and respiratory function stable Cardiovascular status: stable and blood pressure returned to baseline Postop Assessment: no apparent nausea or vomiting Anesthetic complications: no   No notable events documented.   Last Vitals:  Vitals:   06/02/21 0916 06/02/21 0917  BP: 132/64   Pulse: 67 71  Resp: 20 18  Temp:  36.8 C  SpO2: 99% 98%    Last Pain:  Vitals:   06/02/21 0917  TempSrc: Oral  PainSc: 0-No pain                 Daelin Haste C Tasheba Henson

## 2021-06-02 NOTE — Interval H&P Note (Signed)
History and Physical Interval Note:  06/02/2021 8:51 AM  Teresa Howe  has presented today for surgery, with the diagnosis of nuclear scleroisis age related cataract; right eye.  The various methods of treatment have been discussed with the patient and family. After consideration of risks, benefits and other options for treatment, the patient has consented to  Procedure(s) with comments: CATARACT EXTRACTION PHACO AND INTRAOCULAR LENS PLACEMENT (IOC) (Right) - CDE:  as a surgical intervention.  The patient's history has been reviewed, patient examined, no change in status, stable for surgery.  I have reviewed the patient's chart and labs.  Questions were answered to the patient's satisfaction.     Baruch Goldmann

## 2021-06-04 ENCOUNTER — Encounter (HOSPITAL_COMMUNITY): Payer: Self-pay | Admitting: Ophthalmology

## 2021-06-10 ENCOUNTER — Encounter (HOSPITAL_COMMUNITY): Payer: Self-pay

## 2021-06-10 ENCOUNTER — Encounter (HOSPITAL_COMMUNITY)
Admission: RE | Admit: 2021-06-10 | Discharge: 2021-06-10 | Disposition: A | Discharge status: Home / Self Care | Payer: Medicare HMO | Source: Ambulatory Visit | Attending: Ophthalmology | Admitting: Ophthalmology

## 2021-06-10 ENCOUNTER — Other Ambulatory Visit: Payer: Self-pay

## 2021-06-11 NOTE — H&P (Signed)
Surgical History & Physical ? ?Patient Name: Teresa Howe DOB: [birth year] ? ?Surgery: Cataract extraction with intraocular lens implant phacoemulsification; Left Eye ? ?Surgeon: Baruch Goldmann MD ?Surgery Date:  06-16-21 ?Pre-Op Date:  06-09-21 ?HPI: ?A 41 Yr. old female patient is returning after cataract post-op. The right eye is affected. Status post cataract post-op, which began 1 weeks ago: Since the last visit, the affected area is doing well. The patient's vision is improved. Patient is following medication instructions, the patient is using the combination post op drop BID OD. The patient experiences no eye pain and no flashes, floater, shadow, curtain or veil. The patient also presents for pre op OS. The patient still complains of cloudy vision OS, and states that is making it difficult to see far away, like when trying to recognize someone, or when driving and looking at signs. The patient also has difficulty reading fine print still. This is negatively affecting the patient's quality of life and the patient is unable to function adequately in life with the current level of vision. HPI was performed by Baruch Goldmann . ? ?Medical History: ?Macula Degeneration ?Glaucoma ?High Blood Pressure ?LDL ? ?Review of Systems ?Negative Allergic/Immunologic ?Negative Cardiovascular ?Negative Constitutional ?Negative Ear, Nose, Mouth & Throat ?Negative Endocrine ?Negative Eyes ?Negative Gastrointestinal ?Negative Genitourinary ?Negative Hemotologic/Lymphatic ?Negative Integumentary ?Negative Musculoskeletal ?Negative Neurological ?Negative Psychiatry ?Negative Respiratory ? ?Social ?  Smoker, current status unknown  ? ?Medication ?Prednisolone-Moxifloxacin-Bromfenac,  ?Lisinopril, Atorvastatin, Alendronate, Vitamin D3, Fish Oil,  ? ?Sx/Procedures ?Phaco c IOL OD,  ?Skin cancer removal,  ? ?Drug Allergies  ? NKDA ? ?History & Physical: ?Heent: Cataract, Left Eye ?NECK: supple without bruits ?LUNGS: lungs clear to  auscultation ?CV: regular rate and rhythm ?Abdomen: soft and non-tender ? ?Impression & Plan: ?Assessment: ?1.  CATARACT EXTRACTION STATUS; Right Eye (Z98.41) ?2.  NUCLEAR SCLEROSIS AGE RELATED; Left Eye (H25.12) ?3.  ASTIGMATISM, REGULAR; Both Eyes (H52.223) ? ?Plan: 1.  1 week after cataract surgery. Doing well with improved vision and normal eye pressure. Call with any problems or concerns. ?Continue Pred-Moxi-Brom 2x/day for 3 more weeks. ? ?2.  Cataract accounts for the patient's decreased vision. This visual impairment is not correctable with a tolerable change in glasses or contact lenses. Cataract surgery with an implantation of a new lens should significantly improve the visual and functional status of the patient. Discussed all risks, benefits, alternatives, and potential complications. Discussed the procedures and recovery. Patient desires to have surgery. A-scan ordered and performed today for intra-ocular lens calculations. The surgery will be performed in order to improve vision for driving, reading, and for eye examinations. Recommend phacoemulsification with intra-ocular lens. Recommend Dextenza for post-operative pain and inflammation. ?Left Eye. ?Dilates poorly - shugarcaine by protocol. ?Toric Lens. ? ?3.  Much worse OS - consider Toric Lens left eye only. ?

## 2021-06-12 DIAGNOSIS — H2511 Age-related nuclear cataract, right eye: Secondary | ICD-10-CM | POA: Diagnosis not present

## 2021-06-12 DIAGNOSIS — H2512 Age-related nuclear cataract, left eye: Secondary | ICD-10-CM | POA: Diagnosis not present

## 2021-06-16 ENCOUNTER — Ambulatory Visit (HOSPITAL_COMMUNITY): Payer: Medicare HMO | Admitting: Anesthesiology

## 2021-06-16 ENCOUNTER — Ambulatory Visit (HOSPITAL_COMMUNITY)
Admission: RE | Admit: 2021-06-16 | Discharge: 2021-06-16 | Disposition: A | Discharge status: Home / Self Care | Payer: Medicare HMO | Source: Ambulatory Visit | Attending: Ophthalmology | Admitting: Ophthalmology

## 2021-06-16 ENCOUNTER — Encounter (HOSPITAL_COMMUNITY)
Admission: RE | Disposition: A | Discharge status: Home / Self Care | Payer: Self-pay | Source: Ambulatory Visit | Attending: Ophthalmology

## 2021-06-16 ENCOUNTER — Ambulatory Visit (HOSPITAL_BASED_OUTPATIENT_CLINIC_OR_DEPARTMENT_OTHER): Payer: Medicare HMO | Admitting: Anesthesiology

## 2021-06-16 DIAGNOSIS — Z79899 Other long term (current) drug therapy: Secondary | ICD-10-CM | POA: Insufficient documentation

## 2021-06-16 DIAGNOSIS — F17200 Nicotine dependence, unspecified, uncomplicated: Secondary | ICD-10-CM | POA: Insufficient documentation

## 2021-06-16 DIAGNOSIS — I10 Essential (primary) hypertension: Secondary | ICD-10-CM | POA: Insufficient documentation

## 2021-06-16 DIAGNOSIS — H2512 Age-related nuclear cataract, left eye: Secondary | ICD-10-CM

## 2021-06-16 DIAGNOSIS — K219 Gastro-esophageal reflux disease without esophagitis: Secondary | ICD-10-CM | POA: Diagnosis not present

## 2021-06-16 DIAGNOSIS — H52223 Regular astigmatism, bilateral: Secondary | ICD-10-CM | POA: Diagnosis not present

## 2021-06-16 DIAGNOSIS — R69 Illness, unspecified: Secondary | ICD-10-CM | POA: Diagnosis not present

## 2021-06-16 DIAGNOSIS — Z9841 Cataract extraction status, right eye: Secondary | ICD-10-CM | POA: Insufficient documentation

## 2021-06-16 SURGERY — PHACOEMULSIFICATION, CATARACT, WITH IOL INSERTION
Anesthesia: Monitor Anesthesia Care | Site: Eye | Laterality: Left

## 2021-06-16 MED ORDER — NEOMYCIN-POLYMYXIN-DEXAMETH 3.5-10000-0.1 OP SUSP
OPHTHALMIC | Status: DC | PRN
  Administered 2021-06-16: 2 [drp] via OPHTHALMIC

## 2021-06-16 MED ORDER — POVIDONE-IODINE 5 % OP SOLN
OPHTHALMIC | Status: DC | PRN
Start: 2021-06-16 — End: 2021-06-16
  Administered 2021-06-16: 1 via OPHTHALMIC

## 2021-06-16 MED ORDER — STERILE WATER FOR IRRIGATION IR SOLN
Status: DC | PRN
  Administered 2021-06-16: 250 mL

## 2021-06-16 MED ORDER — TROPICAMIDE 1 % OP SOLN
1.0000 [drp] | OPHTHALMIC | Status: AC | PRN
  Administered 2021-06-16 (×3): 1 [drp] via OPHTHALMIC
  Filled 2021-06-16: qty 3

## 2021-06-16 MED ORDER — EPINEPHRINE PF 1 MG/ML IJ SOLN
INTRAOCULAR | Status: DC | PRN
  Administered 2021-06-16: 1 mL via OPHTHALMIC

## 2021-06-16 MED ORDER — PHENYLEPHRINE-KETOROLAC 1-0.3 % IO SOLN
INTRAOCULAR | Status: AC
  Filled 2021-06-16: qty 4

## 2021-06-16 MED ORDER — TETRACAINE HCL 0.5 % OP SOLN
1.0000 [drp] | OPHTHALMIC | Status: AC | PRN
  Administered 2021-06-16 (×3): 1 [drp] via OPHTHALMIC

## 2021-06-16 MED ORDER — SODIUM HYALURONATE 10 MG/ML IO SOLUTION
PREFILLED_SYRINGE | INTRAOCULAR | Status: DC | PRN
  Administered 2021-06-16: 0.85 mL via INTRAOCULAR

## 2021-06-16 MED ORDER — LACTATED RINGERS IV SOLN: INTRAVENOUS | Status: DC

## 2021-06-16 MED ORDER — EPINEPHRINE PF 1 MG/ML IJ SOLN
INTRAMUSCULAR | Status: AC
  Filled 2021-06-16: qty 1

## 2021-06-16 MED ORDER — SODIUM HYALURONATE 23MG/ML IO SOSY
PREFILLED_SYRINGE | INTRAOCULAR | Status: DC | PRN
Start: 2021-06-16 — End: 2021-06-16
  Administered 2021-06-16: 0.6 mL via INTRAOCULAR

## 2021-06-16 MED ORDER — LIDOCAINE HCL 3.5 % OP GEL
1.0000 "application " | Freq: Once | OPHTHALMIC | Status: AC
  Administered 2021-06-16: 1 via OPHTHALMIC

## 2021-06-16 MED ORDER — PHENYLEPHRINE-KETOROLAC 1-0.3 % IO SOLN
INTRAOCULAR | Status: DC | PRN
  Administered 2021-06-16: 500 mL via OPHTHALMIC

## 2021-06-16 MED ORDER — PHENYLEPHRINE HCL 2.5 % OP SOLN
1.0000 [drp] | OPHTHALMIC | Status: AC | PRN
  Administered 2021-06-16 (×3): 1 [drp] via OPHTHALMIC

## 2021-06-16 MED ORDER — BSS IO SOLN
INTRAOCULAR | Status: DC | PRN
  Administered 2021-06-16: 15 mL via INTRAOCULAR

## 2021-06-16 SURGICAL SUPPLY — 14 items
CATARACT SUITE SIGHTPATH (MISCELLANEOUS) ×2 IMPLANT
CLOTH BEACON ORANGE TIMEOUT ST (SAFETY) ×2 IMPLANT
EYE SHIELD UNIVERSAL CLEAR (GAUZE/BANDAGES/DRESSINGS) ×1 IMPLANT
FEE CATARACT SUITE SIGHTPATH (MISCELLANEOUS) ×1 IMPLANT
GLOVE SURG UNDER POLY LF SZ7 (GLOVE) ×2 IMPLANT
LENS IOL RAYNER 22.0 (Intraocular Lens) ×2 IMPLANT
LENS IOL RAYONE EMV 22.0 (Intraocular Lens) IMPLANT
NDL HYPO 18GX1.5 BLUNT FILL (NEEDLE) ×1 IMPLANT
NEEDLE HYPO 18GX1.5 BLUNT FILL (NEEDLE) ×2 IMPLANT
PAD ARMBOARD 7.5X6 YLW CONV (MISCELLANEOUS) ×2 IMPLANT
SYR TB 1ML LL NO SAFETY (SYRINGE) ×2 IMPLANT
TAPE SURG TRANSPORE 1 IN (GAUZE/BANDAGES/DRESSINGS) IMPLANT
TAPE SURGICAL TRANSPORE 1 IN (GAUZE/BANDAGES/DRESSINGS) ×2
WATER STERILE IRR 250ML POUR (IV SOLUTION) ×2 IMPLANT

## 2021-06-16 NOTE — Anesthesia Postprocedure Evaluation (Signed)
Anesthesia Post Note ? ?Patient: Teresa Howe ? ?Procedure(s) Performed: CATARACT EXTRACTION PHACO AND INTRAOCULAR LENS PLACEMENT (IOC) (Left: Eye) ? ?Patient location during evaluation: Short Stay ?Anesthesia Type: MAC ?Level of consciousness: awake and alert ?Pain management: pain level controlled ?Vital Signs Assessment: post-procedure vital signs reviewed and stable ?Respiratory status: spontaneous breathing ?Cardiovascular status: blood pressure returned to baseline and stable ?Postop Assessment: no apparent nausea or vomiting ?Anesthetic complications: no ? ? ?No notable events documented. ? ? ?Last Vitals:  ?Vitals:  ? 06/16/21 1108 06/16/21 1148  ?BP: 128/70 125/80  ?Pulse: 72 81  ?Resp: 18 16  ?Temp: 36.7 ?C   ?SpO2: 100% 98%  ?  ?Last Pain:  ?Vitals:  ? 06/16/21 1148  ?TempSrc:   ?PainSc: 0-No pain  ? ? ?  ?  ?  ?  ?  ?  ? ?Hideo Googe ? ? ? ? ?

## 2021-06-16 NOTE — Op Note (Signed)
Date of procedure: 06/16/21 ? 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 ?Procedure: Removal of cataract via phacoemulsification and insertion of intra-ocular lens Rayner [id number] +22.0D into the capsular bag of the Left Eye ? ?Attending surgeon: Gerda Diss. Marisa Hua, MD, MA ? ?Anesthesia: MAC, Topical Akten ? ?Complications: None ? ?Estimated Blood Loss: <35m (minimal) ? ?Specimens: None ? ?Implants: As above ? ?Indications:  Visually significant age-related cataract, Left Eye ? ?Procedure:  ?The patient was seen and identified in the pre-operative area. The operative eye was identified and dilated.  The operative eye was marked.  Topical anesthesia was administered to the operative eye.    ? ?The patient was then to the operative suite and placed in the supine position.  A timeout was performed confirming the patient, procedure to be performed, and all other relevant information.   The patient's face was prepped and draped in the usual fashion for intra-ocular surgery.  A lid speculum was placed into the operative eye and the surgical microscope moved into place and focused.  An inferotemporal paracentesis was created using a 20 gauge paracentesis blade.  Shugarcaine was injected into the anterior chamber.  Viscoelastic was injected into the anterior chamber.  A temporal clear-corneal main wound incision was created using a 2.43mmicrokeratome.  A continuous curvilinear capsulorrhexis was initiated using an irrigating cystitome and completed using capsulorrhexis forceps.  Hydrodissection and hydrodeliniation were performed.  Viscoelastic was injected into the anterior chamber.  A phacoemulsification handpiece and a chopper as a second instrument were used to remove the nucleus and epinucleus. The irrigation/aspiration handpiece was used to remove any remaining cortical material.   ? ?The capsular bag was reinflated with viscoelastic, checked, and found to be intact.  The intraocular lens was inserted into the capsular bag.  The irrigation/aspiration handpiece was used to remove any remaining viscoelastic.  The clear corneal wound and paracentesis wounds were then hydrated and checked with Weck-Cels to be watertight.  The lid-speculum and drape was removed, and the patient's face was cleaned with a wet and dry 4x4.  Maxitrol was instilled in the eye. A clear shield was taped over the eye. The patient was taken to the post-operative care unit in good condition, having tolerated the procedure well. ? ?Post-Op Instructions: [facility] and will receive all other orders and instructions. ? ?

## 2021-06-16 NOTE — Interval H&P Note (Signed)
History and Physical Interval Note: ? ?06/16/2021 ?11:23 AM ? ?Teresa Howe  has presented today for surgery, with the diagnosis of nuclear sclerosis age related cataract; left.  The various methods of treatment have been discussed with the patient and family. After consideration of risks, benefits and other options for treatment, the patient has consented to  Procedure(s) with comments: ?CATARACT EXTRACTION PHACO AND INTRAOCULAR LENS PLACEMENT (IOC) (Left) - CDE: as a surgical intervention.  The patient's history has been reviewed, patient examined, no change in status, stable for surgery.  I have reviewed the patient's chart and labs.  Questions were answered to the patient's satisfaction.   ? ? ?Baruch Goldmann ? ? ?

## 2021-06-16 NOTE — Transfer of Care (Signed)
Immediate Anesthesia Transfer of Care Note ? ?Patient: Teresa Howe ? ?Procedure(s) Performed: CATARACT EXTRACTION PHACO AND INTRAOCULAR LENS PLACEMENT (IOC) (Left: Eye) ? ?Patient Location: Short Stay ? ?Anesthesia Type:MAC ? ?Level of Consciousness: awake ? ?Airway & Oxygen Therapy: Patient Spontanous Breathing ? ?Post-op Assessment: Report given to RN ? ?Post vital signs: Reviewed and stable ? ?Last Vitals:  ?Vitals Value Taken Time  ?BP    ?Temp    ?Pulse    ?Resp    ?SpO2    ? ? ?Last Pain:  ?Vitals:  ? 06/16/21 1108  ?TempSrc: Oral  ?PainSc: 0-No pain  ?   ? ?Patients Stated Pain Goal: 5 (06/16/21 1108) ? ?Complications: No notable events documented. ?

## 2021-06-16 NOTE — Discharge Instructions (Signed)
Please discharge patient when stable, will follow up today with Dr. Leata Dominy at the [facility] office immediately following discharge.  Leave shield in place until visit.  All paperwork with discharge instructions will be given at the office.   [facility] Address:  [address]  [place], [place] [address]  

## 2021-06-16 NOTE — Anesthesia Preprocedure Evaluation (Signed)
Anesthesia Evaluation  ?Patient identified by MRN, date of birth, ID band ?Patient awake ? ? ? ?Reviewed: ?Allergy & Precautions, H&P , NPO status , Patient's Chart, lab work & pertinent test results, reviewed documented beta blocker date and time  ? ?Airway ?Mallampati: II ? ?TM Distance: >3 FB ?Neck ROM: Full ? ? ? Dental ?no notable dental hx. ?(+) Lower Dentures, Upper Dentures ?  ?Pulmonary ?neg pulmonary ROS, Current Smoker and Patient abstained from smoking.,  ?  ?Pulmonary exam normal ?breath sounds clear to auscultation ? ? ? ? ? ? Cardiovascular ?Exercise Tolerance: Good ?hypertension, Pt. on medications ?negative cardio ROS ?Normal cardiovascular exam ?Rhythm:Regular Rate:Normal ? ? ?  ?Neuro/Psych ?negative neurological ROS ? negative psych ROS  ? GI/Hepatic ?negative GI ROS, Neg liver ROS, GERD  Medicated and Controlled,  ?Endo/Other  ?negative endocrine ROS ? Renal/GU ?negative Renal ROS  ?negative genitourinary ?  ?Musculoskeletal ?negative musculoskeletal ROS ?(+)  ? Abdominal ?  ?Peds ? Hematology ?negative hematology ROS ?(+)   ?Anesthesia Other Findings ? ? Reproductive/Obstetrics ?negative OB ROS ? ?  ? ? ? ? ? ? ? ? ? ? ? ? ? ?  ?  ? ? ? ? ? ? ? ? ?Anesthesia Physical ? ?Anesthesia Plan ? ?ASA: 2 ? ?Anesthesia Plan: MAC  ? ?Post-op Pain Management: Minimal or no pain anticipated  ? ?Induction: Intravenous ? ?PONV Risk Score and Plan:  ? ?Airway Management Planned: Natural Airway and Nasal Cannula ? ?Additional Equipment:  ? ?Intra-op Plan:  ? ?Post-operative Plan:  ? ?Informed Consent: I have reviewed the patients History and Physical, chart, labs and discussed the procedure including the risks, benefits and alternatives for the proposed anesthesia with the patient or authorized representative who has indicated his/her understanding and acceptance.  ? ? ? ?Dental advisory given ? ?Plan Discussed with: CRNA and Surgeon ? ?Anesthesia Plan Comments:    ? ? ? ? ? ? ?Anesthesia Quick Evaluation                                  Anesthesia Evaluation  ?Patient identified by MRN, date of birth, ID band ?Patient awake ? ? ? ?Reviewed: ?Allergy & Precautions, NPO status , Patient's Chart, lab work & pertinent test results ? ?Airway ?Mallampati: II ? ?TM Distance: >3 FB ?Neck ROM: Full ? ? ? Dental ? ?(+) Lower Dentures, Upper Dentures ?  ?Pulmonary ?Current Smoker and Patient abstained from smoking.,  ?  ?Pulmonary exam normal ?breath sounds clear to auscultation ? ? ? ? ? ? Cardiovascular ?Exercise Tolerance: Good ?hypertension, Pt. on medications ?Normal cardiovascular exam ?Rhythm:Regular Rate:Normal ? ? ?  ?Neuro/Psych ?negative neurological ROS ? negative psych ROS  ? GI/Hepatic ?Neg liver ROS, GERD  Medicated and Controlled,  ?Endo/Other  ?negative endocrine ROS ? Renal/GU ?negative Renal ROS  ? ?  ?Musculoskeletal ?negative musculoskeletal ROS ?(+)  ? Abdominal ?  ?Peds ? Hematology ?negative hematology ROS ?(+)   ?Anesthesia Other Findings ? ? Reproductive/Obstetrics ?negative OB ROS ? ?  ? ? ? ? ? ? ? ? ? ? ? ? ? ?  ?  ? ? ? ? ? ? ? ? ?Anesthesia Physical ?Anesthesia Plan ? ?ASA: 2 ? ?Anesthesia Plan: MAC  ? ?Post-op Pain Management: Minimal or no pain anticipated  ? ?Induction: Intravenous ? ?PONV Risk Score and Plan:  ? ?Airway Management Planned: Natural Airway and Nasal Cannula ? ?Additional Equipment:  ? ?  Intra-op Plan:  ? ?Post-operative Plan:  ? ?Informed Consent: I have reviewed the patients History and Physical, chart, labs and discussed the procedure including the risks, benefits and alternatives for the proposed anesthesia with the patient or authorized representative who has indicated his/her understanding and acceptance.  ? ? ? ?Dental advisory given ? ?Plan Discussed with: CRNA and Surgeon ? ?Anesthesia Plan Comments:   ? ? ? ? ? ? ?Anesthesia Quick Evaluation ? ? ?

## 2021-06-18 ENCOUNTER — Encounter (HOSPITAL_COMMUNITY): Payer: Self-pay | Admitting: Ophthalmology

## 2021-06-24 DIAGNOSIS — H2512 Age-related nuclear cataract, left eye: Secondary | ICD-10-CM | POA: Diagnosis not present

## 2021-06-30 DIAGNOSIS — L570 Actinic keratosis: Secondary | ICD-10-CM | POA: Diagnosis not present

## 2021-07-24 ENCOUNTER — Ambulatory Visit (HOSPITAL_COMMUNITY)
Admission: RE | Admit: 2021-07-24 | Discharge: 2021-07-24 | Disposition: A | Discharge status: Home / Self Care | Payer: Medicare HMO | Source: Ambulatory Visit | Attending: Family Medicine | Admitting: Family Medicine

## 2021-07-24 DIAGNOSIS — Z780 Asymptomatic menopausal state: Secondary | ICD-10-CM | POA: Diagnosis not present

## 2021-07-24 DIAGNOSIS — M810 Age-related osteoporosis without current pathological fracture: Secondary | ICD-10-CM | POA: Insufficient documentation

## 2021-07-24 DIAGNOSIS — M85832 Other specified disorders of bone density and structure, left forearm: Secondary | ICD-10-CM | POA: Diagnosis not present

## 2021-08-07 DIAGNOSIS — Z0100 Encounter for examination of eyes and vision without abnormal findings: Secondary | ICD-10-CM | POA: Diagnosis not present

## 2021-09-10 DIAGNOSIS — E785 Hyperlipidemia, unspecified: Secondary | ICD-10-CM | POA: Diagnosis not present

## 2021-09-10 DIAGNOSIS — M810 Age-related osteoporosis without current pathological fracture: Secondary | ICD-10-CM | POA: Diagnosis not present

## 2021-09-10 DIAGNOSIS — I10 Essential (primary) hypertension: Secondary | ICD-10-CM | POA: Diagnosis not present

## 2021-09-10 DIAGNOSIS — K08409 Partial loss of teeth, unspecified cause, unspecified class: Secondary | ICD-10-CM | POA: Diagnosis not present

## 2021-09-10 DIAGNOSIS — Z8249 Family history of ischemic heart disease and other diseases of the circulatory system: Secondary | ICD-10-CM | POA: Diagnosis not present

## 2021-09-10 DIAGNOSIS — Z833 Family history of diabetes mellitus: Secondary | ICD-10-CM | POA: Diagnosis not present

## 2021-09-10 DIAGNOSIS — Z7983 Long term (current) use of bisphosphonates: Secondary | ICD-10-CM | POA: Diagnosis not present

## 2021-09-10 DIAGNOSIS — Z87891 Personal history of nicotine dependence: Secondary | ICD-10-CM | POA: Diagnosis not present

## 2021-10-08 ENCOUNTER — Other Ambulatory Visit (HOSPITAL_COMMUNITY): Payer: Self-pay | Admitting: Internal Medicine

## 2021-10-08 DIAGNOSIS — Z1231 Encounter for screening mammogram for malignant neoplasm of breast: Secondary | ICD-10-CM

## 2021-10-13 DIAGNOSIS — E782 Mixed hyperlipidemia: Secondary | ICD-10-CM | POA: Diagnosis not present

## 2021-10-13 DIAGNOSIS — R7303 Prediabetes: Secondary | ICD-10-CM | POA: Diagnosis not present

## 2021-10-13 DIAGNOSIS — I10 Essential (primary) hypertension: Secondary | ICD-10-CM | POA: Diagnosis not present

## 2021-10-20 ENCOUNTER — Other Ambulatory Visit (HOSPITAL_COMMUNITY): Payer: Self-pay | Admitting: Family Medicine

## 2021-10-20 ENCOUNTER — Other Ambulatory Visit: Payer: Self-pay | Admitting: Family Medicine

## 2021-10-20 DIAGNOSIS — Z87891 Personal history of nicotine dependence: Secondary | ICD-10-CM

## 2021-10-20 DIAGNOSIS — I129 Hypertensive chronic kidney disease with stage 1 through stage 4 chronic kidney disease, or unspecified chronic kidney disease: Secondary | ICD-10-CM | POA: Diagnosis not present

## 2021-10-20 DIAGNOSIS — K219 Gastro-esophageal reflux disease without esophagitis: Secondary | ICD-10-CM | POA: Diagnosis not present

## 2021-10-20 DIAGNOSIS — N1831 Chronic kidney disease, stage 3a: Secondary | ICD-10-CM | POA: Diagnosis not present

## 2021-10-20 DIAGNOSIS — E782 Mixed hyperlipidemia: Secondary | ICD-10-CM | POA: Diagnosis not present

## 2021-10-20 DIAGNOSIS — M810 Age-related osteoporosis without current pathological fracture: Secondary | ICD-10-CM | POA: Diagnosis not present

## 2021-10-20 DIAGNOSIS — Z6820 Body mass index (BMI) 20.0-20.9, adult: Secondary | ICD-10-CM | POA: Diagnosis not present

## 2021-10-20 DIAGNOSIS — R7303 Prediabetes: Secondary | ICD-10-CM | POA: Diagnosis not present

## 2021-11-03 ENCOUNTER — Ambulatory Visit (HOSPITAL_COMMUNITY)
Admission: RE | Admit: 2021-11-03 | Discharge: 2021-11-03 | Disposition: A | Discharge status: Home / Self Care | Payer: Medicare HMO | Source: Ambulatory Visit | Attending: Internal Medicine | Admitting: Internal Medicine

## 2021-11-03 DIAGNOSIS — Z1231 Encounter for screening mammogram for malignant neoplasm of breast: Secondary | ICD-10-CM | POA: Insufficient documentation

## 2021-11-19 DIAGNOSIS — R519 Headache, unspecified: Secondary | ICD-10-CM | POA: Diagnosis not present

## 2021-11-19 DIAGNOSIS — R6883 Chills (without fever): Secondary | ICD-10-CM | POA: Diagnosis not present

## 2021-11-19 DIAGNOSIS — M791 Myalgia, unspecified site: Secondary | ICD-10-CM | POA: Diagnosis not present

## 2021-11-19 DIAGNOSIS — J069 Acute upper respiratory infection, unspecified: Secondary | ICD-10-CM | POA: Diagnosis not present

## 2021-11-19 DIAGNOSIS — R509 Fever, unspecified: Secondary | ICD-10-CM | POA: Diagnosis not present

## 2021-11-20 ENCOUNTER — Ambulatory Visit (HOSPITAL_COMMUNITY)
Admission: RE | Admit: 2021-11-20 | Discharge: 2021-11-20 | Disposition: A | Discharge status: Home / Self Care | Payer: Medicare HMO | Source: Ambulatory Visit | Attending: Family Medicine | Admitting: Family Medicine

## 2021-11-20 DIAGNOSIS — Z87891 Personal history of nicotine dependence: Secondary | ICD-10-CM | POA: Diagnosis not present

## 2021-12-01 DIAGNOSIS — R051 Acute cough: Secondary | ICD-10-CM | POA: Diagnosis not present

## 2021-12-01 DIAGNOSIS — R0602 Shortness of breath: Secondary | ICD-10-CM | POA: Diagnosis not present

## 2021-12-01 DIAGNOSIS — J069 Acute upper respiratory infection, unspecified: Secondary | ICD-10-CM | POA: Diagnosis not present

## 2021-12-30 DIAGNOSIS — D485 Neoplasm of uncertain behavior of skin: Secondary | ICD-10-CM | POA: Diagnosis not present

## 2021-12-30 DIAGNOSIS — C44311 Basal cell carcinoma of skin of nose: Secondary | ICD-10-CM | POA: Diagnosis not present

## 2021-12-30 DIAGNOSIS — L570 Actinic keratosis: Secondary | ICD-10-CM | POA: Diagnosis not present

## 2022-01-08 DIAGNOSIS — C44311 Basal cell carcinoma of skin of nose: Secondary | ICD-10-CM | POA: Diagnosis not present

## 2022-02-12 DIAGNOSIS — Z961 Presence of intraocular lens: Secondary | ICD-10-CM | POA: Diagnosis not present

## 2022-04-24 DIAGNOSIS — I129 Hypertensive chronic kidney disease with stage 1 through stage 4 chronic kidney disease, or unspecified chronic kidney disease: Secondary | ICD-10-CM | POA: Diagnosis not present

## 2022-04-29 DIAGNOSIS — K449 Diaphragmatic hernia without obstruction or gangrene: Secondary | ICD-10-CM | POA: Diagnosis not present

## 2022-04-29 DIAGNOSIS — Z0001 Encounter for general adult medical examination with abnormal findings: Secondary | ICD-10-CM | POA: Diagnosis not present

## 2022-04-29 DIAGNOSIS — Z0000 Encounter for general adult medical examination without abnormal findings: Secondary | ICD-10-CM | POA: Diagnosis not present

## 2022-04-29 DIAGNOSIS — K219 Gastro-esophageal reflux disease without esophagitis: Secondary | ICD-10-CM | POA: Diagnosis not present

## 2022-04-29 DIAGNOSIS — I129 Hypertensive chronic kidney disease with stage 1 through stage 4 chronic kidney disease, or unspecified chronic kidney disease: Secondary | ICD-10-CM | POA: Diagnosis not present

## 2022-04-29 DIAGNOSIS — I700 Atherosclerosis of aorta: Secondary | ICD-10-CM | POA: Diagnosis not present

## 2022-04-29 DIAGNOSIS — M810 Age-related osteoporosis without current pathological fracture: Secondary | ICD-10-CM | POA: Diagnosis not present

## 2022-04-29 DIAGNOSIS — N1831 Chronic kidney disease, stage 3a: Secondary | ICD-10-CM | POA: Diagnosis not present

## 2022-04-29 DIAGNOSIS — Z87891 Personal history of nicotine dependence: Secondary | ICD-10-CM | POA: Diagnosis not present

## 2022-04-29 DIAGNOSIS — E782 Mixed hyperlipidemia: Secondary | ICD-10-CM | POA: Diagnosis not present

## 2022-04-29 DIAGNOSIS — R7303 Prediabetes: Secondary | ICD-10-CM | POA: Diagnosis not present

## 2022-05-07 ENCOUNTER — Encounter (INDEPENDENT_AMBULATORY_CARE_PROVIDER_SITE_OTHER): Payer: Self-pay | Admitting: *Deleted

## 2022-06-30 DIAGNOSIS — D485 Neoplasm of uncertain behavior of skin: Secondary | ICD-10-CM | POA: Diagnosis not present

## 2022-06-30 DIAGNOSIS — L570 Actinic keratosis: Secondary | ICD-10-CM | POA: Diagnosis not present

## 2022-06-30 DIAGNOSIS — C44319 Basal cell carcinoma of skin of other parts of face: Secondary | ICD-10-CM | POA: Diagnosis not present

## 2022-07-06 ENCOUNTER — Telehealth (INDEPENDENT_AMBULATORY_CARE_PROVIDER_SITE_OTHER): Payer: Self-pay | Admitting: Gastroenterology

## 2022-07-06 ENCOUNTER — Encounter (INDEPENDENT_AMBULATORY_CARE_PROVIDER_SITE_OTHER): Payer: Self-pay | Admitting: Gastroenterology

## 2022-07-06 ENCOUNTER — Ambulatory Visit (INDEPENDENT_AMBULATORY_CARE_PROVIDER_SITE_OTHER): Payer: Medicare HMO | Admitting: Gastroenterology

## 2022-07-06 VITALS — BP 119/70 | HR 73 | Temp 96.6°F | Ht 59.0 in | Wt 110.9 lb

## 2022-07-06 DIAGNOSIS — K449 Diaphragmatic hernia without obstruction or gangrene: Secondary | ICD-10-CM | POA: Insufficient documentation

## 2022-07-06 DIAGNOSIS — K219 Gastro-esophageal reflux disease without esophagitis: Secondary | ICD-10-CM | POA: Insufficient documentation

## 2022-07-06 NOTE — H&P (View-Only) (Signed)
Aydan Phoenix Castaneda, M.D. Gastroenterology & Hepatology [facility] [address] [place], [place] [address] Primary Care Physician: Hall, John Z, MD [address] [place]  [phone]  Referring MD: PCP  Chief Complaint: Heartburn  History of Present Illness: Teresa Howe is a 77 y.o. female with PHM GERD,HLD, HTN, CKD, who presents for evaluation of heartburn.  Patient reports that for the last year she has presented recurrent episodes of heartburn. She reports that it used to be present occasionally but it has worsened for the last 2 months. She reports that she is now having burning sensation in her chest and regurgitation of acid contents on a daily basis, especially during the night.  Denies burping or dysphagia.   She reports that she was given a medication for 3 months (unclear if PPI or anti H2), which led to improvement of her symptoms. However, she was told she could not take it for longer than 3 months, so she is now only taking 3 Tums during the day.  The patient denies having any nausea, vomiting, fever, chills, hematochezia, melena, hematemesis, abdominal distention, abdominal pain, diarrhea, jaundice, pruritus or weight loss.  Notably, she had a CT chest in 11/20/21 showing a moderate to large hiatal hernia. I reviewed this image, there was presence of at least 25% of gastric contents in the hernia sac.  Last EGD:never Last Colonoscopy:11/2018 Dicerticulosis One 4 mm polyp in sigmoid colon path  TA Recommended to repeat in 5 years.  FHx: neg for any gastrointestinal/liver disease, no malignancies per patient but chart reported father had colon cancer Social: former smoking, neg alcohol or illicit drug use Surgical: partial hysterectomy  Past Medical History: Past Medical History:  Diagnosis Date   GERD (gastroesophageal reflux disease)    Hypercholesteremia    Hypertension     Past Surgical History: Past  Surgical History:  Procedure Laterality Date   ABDOMINAL HYSTERECTOMY     CATARACT EXTRACTION W/PHACO Right 06/02/2021   Procedure: CATARACT EXTRACTION PHACO AND INTRAOCULAR LENS PLACEMENT (IOC);  Surgeon: Wrzosek, James, MD;  Location: [facility];  Service: Ophthalmology;  Laterality: Right;  CDE: 16.00   CATARACT EXTRACTION W/PHACO Left 06/16/2021   Procedure: CATARACT EXTRACTION PHACO AND INTRAOCULAR LENS PLACEMENT (IOC);  Surgeon: Wrzosek, James, MD;  Location: [facility];  Service: Ophthalmology;  Laterality: Left;  CDE:15.56   COLONOSCOPY N/A 03/01/2013   Procedure: COLONOSCOPY;  Surgeon: Najeeb U Rehman, MD;  Location: [facility];  Service: Endoscopy;  Laterality: N/A;  830-moved to 930 Ann to notify pt   COLONOSCOPY N/A 11/16/2018   Procedure: COLONOSCOPY;  Surgeon: Rehman, Najeeb U, MD;  Location: [facility];  Service: Endoscopy;  Laterality: N/A;  930   POLYPECTOMY  11/16/2018   Procedure: POLYPECTOMY;  Surgeon: Rehman, Najeeb U, MD;  [facility]: [facility];  Service: Endoscopy;;  colon     Family History: Family History  Problem Relation Age of Onset   Colon cancer Father     Social History: Social History   Tobacco Use  Smoking Status Every Day   Packs/day: 0.50   Years: 4.00   Additional pack years: 0.00   Total pack years: 2.00   Types: Cigarettes  Smokeless Tobacco Never   Social History   Substance and Sexual Activity  Alcohol Use Not Currently   Social History   Substance and Sexual Activity  Drug Use Never    Allergies: No Known Allergies  Medications: Current Outpatient Medications  Medication Sig   Dispense Refill   acetaminophen (TYLENOL) 325 MG tablet Take 650 mg by mouth every 6 (six) hours as needed.     alendronate (FOSAMAX) 70 MG tablet Take 70 mg by mouth every Tuesday.     atorvastatin (LIPITOR) 40 MG tablet Take 40 mg by mouth at bedtime.     Calcium Carb-Cholecalciferol (CALCIUM 600+D3 PO) Take 1 tablet by mouth daily.     cholecalciferol  (VITAMIN D3) 10 MCG (400 UNIT) TABS tablet Take 400 Units by mouth daily.     ibuprofen (ADVIL) 200 MG tablet Take 200 mg by mouth every 8 (eight) hours as needed (pain.).     lisinopril (ZESTRIL) 20 MG tablet Take 20 mg by mouth daily.     Multiple Vitamin (MULTIVITAMIN WITH MINERALS) TABS tablet Take 1 tablet by mouth daily.     Omega-3 Fatty Acids (FISH OIL) 1000 MG CAPS Take 1,000 mg by mouth daily.     vitamin B-12 (CYANOCOBALAMIN) 100 MCG tablet Take 100 mcg by mouth daily.     No current facility-administered medications for this visit.    Review of Systems: GENERAL: negative for malaise, night sweats HEENT: No changes in hearing or vision, no nose bleeds or other nasal problems. NECK: Negative for lumps, goiter, pain and significant neck swelling RESPIRATORY: Negative for cough, wheezing CARDIOVASCULAR: Negative for chest pain, leg swelling, palpitations, orthopnea GI: SEE HPI MUSCULOSKELETAL: Negative for joint pain or swelling, back pain, and muscle pain. SKIN: Negative for lesions, rash PSYCH: Negative for sleep disturbance, mood disorder and recent psychosocial stressors. HEMATOLOGY Negative for prolonged bleeding, bruising easily, and swollen nodes. ENDOCRINE: Negative for cold or heat intolerance, polyuria, polydipsia and goiter. NEURO: negative for tremor, gait imbalance, syncope and seizures. The remainder of the review of systems is noncontributory.   Physical Exam: BP 119/70 (BP Location: Left Arm, Patient Position: Sitting, Cuff Size: Normal)   Pulse 73   Temp (!) 96.6 F (35.9 C) (Temporal)   Ht 4' 11" (1.499 m)   Wt 110 lb 14.4 oz (50.3 kg)   BMI 22.40 kg/m  GENERAL: The patient is AO x3, in no acute distress. HEENT: Head is normocephalic and atraumatic. EOMI are intact. Mouth is well hydrated and without lesions. NECK: Supple. No masses LUNGS: Clear to auscultation. No presence of rhonchi/wheezing/rales. Adequate chest expansion HEART: RRR, normal s1 and  s2. ABDOMEN: Soft, nontender, no guarding, no peritoneal signs, and nondistended. BS +. No masses. EXTREMITIES: Without any cyanosis, clubbing, rash, lesions or edema. NEUROLOGIC: AOx3, no focal motor deficit. SKIN: no jaundice, no rashes   Imaging/Labs: as above  I personally reviewed and interpreted the available labs, imaging and endoscopic files.  Impression and Plan: Teresa Howe is a 76 y.o. female with PHM GERD,HLD, HTN, CKD, who presents for evaluation of heartburn.  Patient has presented recurrent episodes of heartburn which are highly suggestive of GERD.  She had clinical improvement with medication trial in the past but it is not clear what she took at that time.  However, her symptoms have recurred since she stopped this medication.  She had a CT of the chest last year that showed presence of moderate to large hiatal hernia which may be predisposing her to reflux episodes.  Will explore her symptoms and imaging findings further with an EGD, and depending on findings we will start her on omeprazole 20 mg daily.  Will check a CBC and a CMP today.  - Schedule EGD -May start on omeprazole 20 mg qday after EGD -  Check CBC and CMP  All questions were answered.      Symantha Steeber Castaneda, MD Gastroenterology and Hepatology [facility]  

## 2022-07-06 NOTE — Telephone Encounter (Signed)
Pt called in and needed to reschedule EGD. Pt was on for 07/10/22. Has been rescheduled for 07/14/22. Updated instructions up front for pt.

## 2022-07-06 NOTE — Progress Notes (Signed)
Teresa Howe, M.D. Gastroenterology & Hepatology [facility] [address] [place], [place] [address] Primary Care Physician: Celene Squibb, MD [address] [place] [address]  Referring MD: PCP  Chief Complaint: Heartburn  History of Present Illness: Teresa Howe is a 77 y.o. female with PHM GERD,HLD, HTN, CKD, who presents for evaluation of heartburn.  Patient reports that for the last year she has presented recurrent episodes of heartburn. She reports that it used to be present occasionally but it has worsened for the last 2 months. She reports that she is now having burning sensation in her chest and regurgitation of acid contents on a daily basis, especially during the night.  Denies burping or dysphagia.   She reports that she was given a medication for 3 months (unclear if PPI or anti H2), which led to improvement of her symptoms. However, she was told she could not take it for longer than 3 months, so she is now only taking 3 Tums during the day.  The patient denies having any nausea, vomiting, fever, chills, hematochezia, melena, hematemesis, abdominal distention, abdominal pain, diarrhea, jaundice, pruritus or weight loss.  Notably, she had a CT chest in 11/20/21 showing a moderate to large hiatal hernia. I reviewed this image, there was presence of at least 25% of gastric contents in the hernia sac.  Last [id number] Last [id number] Dicerticulosis One 4 mm polyp in sigmoid colon path  TA Recommended to repeat in 5 years.  FHx: neg for any gastrointestinal/liver disease, no malignancies per patient but chart reported father had colon cancer Social: former smoking, neg alcohol or illicit drug use Surgical: partial hysterectomy  Past Medical History: Past Medical History:  Diagnosis Date   GERD (gastroesophageal reflux disease)    Hypercholesteremia    Hypertension     Past Surgical History: Past  Surgical History:  Procedure Laterality Date   ABDOMINAL HYSTERECTOMY     CATARACT EXTRACTION W/PHACO Right 06/02/2021   Procedure: CATARACT EXTRACTION PHACO AND INTRAOCULAR LENS PLACEMENT (IOC);  Surgeon: Baruch Goldmann, MD;  Location: [facility];  Service: Ophthalmology;  Laterality: Right;  CDE: 16.00   CATARACT EXTRACTION W/PHACO Left 06/16/2021   Procedure: CATARACT EXTRACTION PHACO AND INTRAOCULAR LENS PLACEMENT (IOC);  Surgeon: Baruch Goldmann, MD;  Location: [facility];  Service: Ophthalmology;  Laterality: Left;  CDE:15.56   COLONOSCOPY N/A 03/01/2013   Procedure: COLONOSCOPY;  Surgeon: Rogene Houston, MD;  [facility];  Service: Endoscopy;  Laterality: N/A;  830-moved to 930 Ann to notify pt   COLONOSCOPY N/A 11/16/2018   Procedure: COLONOSCOPY;  Surgeon: Rogene Houston, MD;  [facility];  Service: Endoscopy;  Laterality: N/A;  930   POLYPECTOMY  11/16/2018   Procedure: POLYPECTOMY;  Surgeon: Rogene Houston, MD;  [facility];  Service: Endoscopy;;  colon     Family History: Family History  Problem Relation Age of Onset   Colon cancer Father     Social History: Social History   Tobacco Use  Smoking Status Every Day   Packs/day: 0.50   Years: 4.00   Additional pack years: 0.00   Total pack years: 2.00   Types: Cigarettes  Smokeless Tobacco Never   Social History   Substance and Sexual Activity  Alcohol Use Not Currently   Social History   Substance and Sexual Activity  Drug Use Never    Allergies: No Known Allergies  Medications: Current Outpatient Medications  Medication Sig  Dispense Refill   acetaminophen (TYLENOL) 325 MG tablet Take 650 mg by mouth every 6 (six) hours as needed.     alendronate (FOSAMAX) 70 MG tablet Take 70 mg by mouth every Tuesday.     atorvastatin (LIPITOR) 40 MG tablet Take 40 mg by mouth at bedtime.     Calcium Carb-Cholecalciferol (CALCIUM 600+D3 PO) Take 1 tablet by mouth daily.     cholecalciferol  (VITAMIN D3) 10 MCG (400 UNIT) TABS tablet Take 400 Units by mouth daily.     ibuprofen (ADVIL) 200 MG tablet Take 200 mg by mouth every 8 (eight) hours as needed (pain.).     lisinopril (ZESTRIL) 20 MG tablet Take 20 mg by mouth daily.     Multiple Vitamin (MULTIVITAMIN WITH MINERALS) TABS tablet Take 1 tablet by mouth daily.     Omega-3 Fatty Acids (FISH OIL) 1000 MG CAPS Take 1,000 mg by mouth daily.     vitamin B-12 (CYANOCOBALAMIN) 100 MCG tablet Take 100 mcg by mouth daily.     No current facility-administered medications for this visit.    Review of Systems: GENERAL: negative for malaise, night sweats HEENT: No changes in hearing or vision, no nose bleeds or other nasal problems. NECK: Negative for lumps, goiter, pain and significant neck swelling RESPIRATORY: Negative for cough, wheezing CARDIOVASCULAR: Negative for chest pain, leg swelling, palpitations, orthopnea GI: SEE HPI MUSCULOSKELETAL: Negative for joint pain or swelling, back pain, and muscle pain. SKIN: Negative for lesions, rash PSYCH: Negative for sleep disturbance, mood disorder and recent psychosocial stressors. HEMATOLOGY Negative for prolonged bleeding, bruising easily, and swollen nodes. ENDOCRINE: Negative for cold or heat intolerance, polyuria, polydipsia and goiter. NEURO: negative for tremor, gait imbalance, syncope and seizures. The remainder of the review of systems is noncontributory.   Physical Exam: BP 119/70 (BP Location: Left Arm, Patient Position: Sitting, Cuff Size: Normal)   Pulse 73   Temp (!) 96.6 F (35.9 C) (Temporal)   Ht 4\' 11"  (1.499 m)   Wt 110 lb 14.4 oz (50.3 kg)   BMI 22.40 kg/m  GENERAL: The patient is AO x3, in no acute distress. HEENT: Head is normocephalic and atraumatic. EOMI are intact. Mouth is well hydrated and without lesions. NECK: Supple. No masses LUNGS: Clear to auscultation. No presence of rhonchi/wheezing/rales. Adequate chest expansion HEART: RRR, normal s1 and  s2. ABDOMEN: Soft, nontender, no guarding, no peritoneal signs, and nondistended. BS +. No masses. EXTREMITIES: Without any cyanosis, clubbing, rash, lesions or edema. NEUROLOGIC: AOx3, no focal motor deficit. SKIN: no jaundice, no rashes   Imaging/Labs: as above  I personally reviewed and interpreted the available labs, imaging and endoscopic files.  Impression and Plan: JAZARIA CANET is a 77 y.o. female with PHM GERD,HLD, HTN, CKD, who presents for evaluation of heartburn.  Patient has presented recurrent episodes of heartburn which are highly suggestive of GERD.  She had clinical improvement with medication trial in the past but it is not clear what she took at that time.  However, her symptoms have recurred since she stopped this medication.  She had a CT of the chest last year that showed presence of moderate to large hiatal hernia which may be predisposing her to reflux episodes.  Will explore her symptoms and imaging findings further with an EGD, and depending on findings we will start her on omeprazole 20 mg daily.  Will check a CBC and a CMP today.  - Schedule EGD -May start on omeprazole 20 mg qday after EGD -  Check CBC and CMP  All questions were answered.      Teresa Peppers, MD Gastroenterology and Hepatology [facility]

## 2022-07-06 NOTE — Patient Instructions (Addendum)
Schedule EGD May start on omeprazole 20 mg qday after EGD Perform blood workup

## 2022-07-13 ENCOUNTER — Other Ambulatory Visit (HOSPITAL_COMMUNITY)
Admission: RE | Admit: 2022-07-13 | Discharge: 2022-07-13 | Disposition: A | Discharge status: Home / Self Care | Payer: Medicare HMO | Source: Ambulatory Visit | Attending: Gastroenterology | Admitting: Gastroenterology

## 2022-07-13 DIAGNOSIS — K219 Gastro-esophageal reflux disease without esophagitis: Secondary | ICD-10-CM | POA: Diagnosis not present

## 2022-07-13 LAB — CBC WITH DIFFERENTIAL/PLATELET
Abs Immature Granulocytes: 0.03 10*3/uL (ref 0.00–0.07)
Basophils Absolute: 0 10*3/uL (ref 0.0–0.1)
Basophils Relative: 1 %
Eosinophils Absolute: 0.2 10*3/uL (ref 0.0–0.5)
Eosinophils Relative: 2 %
HCT: 37.9 % (ref 36.0–46.0)
Hemoglobin: 12.4 g/dL (ref 12.0–15.0)
Immature Granulocytes: 1 %
Lymphocytes Relative: 28 %
Lymphs Abs: 1.9 10*3/uL (ref 0.7–4.0)
MCH: 32.1 pg (ref 26.0–34.0)
MCHC: 32.7 g/dL (ref 30.0–36.0)
MCV: 98.2 fL (ref 80.0–100.0)
Monocytes Absolute: 0.5 10*3/uL (ref 0.1–1.0)
Monocytes Relative: 7 %
Neutro Abs: 4.1 10*3/uL (ref 1.7–7.7)
Neutrophils Relative %: 61 %
Platelets: 250 10*3/uL (ref 150–400)
RBC: 3.86 MIL/uL — ABNORMAL LOW (ref 3.87–5.11)
RDW: 13.3 % (ref 11.5–15.5)
WBC: 6.7 10*3/uL (ref 4.0–10.5)
nRBC: 0 % (ref 0.0–0.2)

## 2022-07-13 LAB — COMPREHENSIVE METABOLIC PANEL
ALT: 20 U/L (ref 0–44)
AST: 26 U/L (ref 15–41)
Albumin: 4 g/dL (ref 3.5–5.0)
Alkaline Phosphatase: 56 U/L (ref 38–126)
Anion gap: 9 (ref 5–15)
BUN: 20 mg/dL (ref 8–23)
CO2: 24 mmol/L (ref 22–32)
Calcium: 10.1 mg/dL (ref 8.9–10.3)
Chloride: 102 mmol/L (ref 98–111)
Creatinine, Ser: 1.17 mg/dL — ABNORMAL HIGH (ref 0.44–1.00)
GFR, Estimated: 49 mL/min — ABNORMAL LOW (ref >60)
Glucose, Bld: 108 mg/dL — ABNORMAL HIGH (ref 70–99)
Potassium: 4.1 mmol/L (ref 3.5–5.1)
Sodium: 135 mmol/L (ref 135–145)
Total Bilirubin: 0.8 mg/dL (ref 0.3–1.2)
Total Protein: 7.2 g/dL (ref 6.5–8.1)

## 2022-07-14 ENCOUNTER — Encounter (HOSPITAL_COMMUNITY)
Admission: RE | Disposition: A | Discharge status: Home / Self Care | Payer: Self-pay | Source: Home / Self Care | Attending: Gastroenterology

## 2022-07-14 ENCOUNTER — Encounter (HOSPITAL_COMMUNITY): Payer: Self-pay | Admitting: Gastroenterology

## 2022-07-14 ENCOUNTER — Ambulatory Visit (HOSPITAL_BASED_OUTPATIENT_CLINIC_OR_DEPARTMENT_OTHER): Payer: Medicare HMO | Admitting: Anesthesiology

## 2022-07-14 ENCOUNTER — Ambulatory Visit (HOSPITAL_COMMUNITY): Payer: Medicare HMO | Admitting: Anesthesiology

## 2022-07-14 ENCOUNTER — Ambulatory Visit (HOSPITAL_COMMUNITY)
Admission: RE | Admit: 2022-07-14 | Discharge: 2022-07-14 | Disposition: A | Discharge status: Home / Self Care | Payer: Medicare HMO | Attending: Gastroenterology | Admitting: Gastroenterology

## 2022-07-14 DIAGNOSIS — K2950 Unspecified chronic gastritis without bleeding: Secondary | ICD-10-CM | POA: Insufficient documentation

## 2022-07-14 DIAGNOSIS — I129 Hypertensive chronic kidney disease with stage 1 through stage 4 chronic kidney disease, or unspecified chronic kidney disease: Secondary | ICD-10-CM | POA: Diagnosis not present

## 2022-07-14 DIAGNOSIS — E785 Hyperlipidemia, unspecified: Secondary | ICD-10-CM | POA: Diagnosis not present

## 2022-07-14 DIAGNOSIS — B9681 Helicobacter pylori [H. pylori] as the cause of diseases classified elsewhere: Secondary | ICD-10-CM | POA: Diagnosis not present

## 2022-07-14 DIAGNOSIS — K209 Esophagitis, unspecified without bleeding: Secondary | ICD-10-CM | POA: Diagnosis not present

## 2022-07-14 DIAGNOSIS — K449 Diaphragmatic hernia without obstruction or gangrene: Secondary | ICD-10-CM

## 2022-07-14 DIAGNOSIS — K2980 Duodenitis without bleeding: Secondary | ICD-10-CM | POA: Diagnosis not present

## 2022-07-14 DIAGNOSIS — R12 Heartburn: Secondary | ICD-10-CM | POA: Diagnosis not present

## 2022-07-14 DIAGNOSIS — K3189 Other diseases of stomach and duodenum: Secondary | ICD-10-CM

## 2022-07-14 DIAGNOSIS — R69 Illness, unspecified: Secondary | ICD-10-CM | POA: Diagnosis not present

## 2022-07-14 DIAGNOSIS — F17210 Nicotine dependence, cigarettes, uncomplicated: Secondary | ICD-10-CM | POA: Diagnosis not present

## 2022-07-14 DIAGNOSIS — N189 Chronic kidney disease, unspecified: Secondary | ICD-10-CM | POA: Diagnosis not present

## 2022-07-14 DIAGNOSIS — K2100 Gastro-esophageal reflux disease with esophagitis, without bleeding: Secondary | ICD-10-CM | POA: Diagnosis not present

## 2022-07-14 HISTORY — PX: BIOPSY: SHX5522

## 2022-07-14 HISTORY — PX: ESOPHAGOGASTRODUODENOSCOPY (EGD) WITH PROPOFOL: SHX5813

## 2022-07-14 SURGERY — ESOPHAGOGASTRODUODENOSCOPY (EGD) WITH PROPOFOL
Anesthesia: General

## 2022-07-14 MED ORDER — LACTATED RINGERS IV SOLN: INTRAVENOUS | Status: DC

## 2022-07-14 MED ORDER — OMEPRAZOLE 40 MG PO CPDR: 40.0000 mg | DELAYED_RELEASE_CAPSULE | Freq: Every day | ORAL | 3 refills | Status: DC

## 2022-07-14 MED ORDER — LIDOCAINE HCL 1 % IJ SOLN
INTRAMUSCULAR | Status: DC | PRN
  Administered 2022-07-14: 50 mg via INTRADERMAL

## 2022-07-14 MED ORDER — PROPOFOL 10 MG/ML IV BOLUS
INTRAVENOUS | Status: DC | PRN
  Administered 2022-07-14 (×3): 50 mg via INTRAVENOUS

## 2022-07-14 NOTE — Interval H&P Note (Signed)
History and Physical Interval Note:  07/14/2022 10:52 AM  Teresa Howe  has presented today for surgery, with the diagnosis of GERD.  The various methods of treatment have been discussed with the patient and family. After consideration of risks, benefits and other options for treatment, the patient has consented to  Procedure(s) with comments: ESOPHAGOGASTRODUODENOSCOPY (EGD) WITH PROPOFOL (N/A) - 2:00PM;ASA 1-2 as a surgical intervention.  The patient's history has been reviewed, patient examined, no change in status, stable for surgery.  I have reviewed the patient's chart and labs.  Questions were answered to the patient's satisfaction.     [name]

## 2022-07-14 NOTE — Anesthesia Preprocedure Evaluation (Signed)
Anesthesia Evaluation  Patient identified by MRN, date of birth, ID band Patient awake    Reviewed: Allergy & Precautions, H&P , NPO status , Patient's Chart, lab work & pertinent test results, reviewed documented beta blocker date and time   Airway Mallampati: II  TM Distance: >3 FB Neck ROM: full    Dental no notable dental hx.    Pulmonary neg pulmonary ROS, Current Smoker   Pulmonary exam normal breath sounds clear to auscultation       Cardiovascular Exercise Tolerance: Good hypertension, negative cardio ROS  Rhythm:regular Rate:Normal     Neuro/Psych  Neuromuscular disease negative neurological ROS  negative psych ROS   GI/Hepatic negative GI ROS, Neg liver ROS, hiatal hernia,GERD  ,,  Endo/Other  negative endocrine ROS    Renal/GU negative Renal ROS  negative genitourinary   Musculoskeletal   Abdominal   Peds  Hematology negative hematology ROS (+)   Anesthesia Other Findings   Reproductive/Obstetrics negative OB ROS                             Anesthesia Physical Anesthesia Plan  ASA: 2  Anesthesia Plan: General   Post-op Pain Management:    Induction:   PONV Risk Score and Plan: Propofol infusion  Airway Management Planned:   Additional Equipment:   Intra-op Plan:   Post-operative Plan:   Informed Consent: I have reviewed the patients History and Physical, chart, labs and discussed the procedure including the risks, benefits and alternatives for the proposed anesthesia with the patient or authorized representative who has indicated his/her understanding and acceptance.     Dental Advisory Given  Plan Discussed with: CRNA  Anesthesia Plan Comments:        Anesthesia Quick Evaluation

## 2022-07-14 NOTE — Op Note (Signed)
[facility] Patient Name: Teresa Howe Procedure Date: 07/14/2022 1:28 PM MRN: [id number] Date of Birth: 12-19-1945 Attending MD: Katrinka Blazing , , [id number] CSN: [id number] Age: 77 Admit Type: Outpatient Procedure:                Upper GI endoscopy Indications:              Heartburn Providers:                Katrinka Blazing, Sheran Fava, Burke Keels, Technician Referring MD:              Medicines:                Monitored Anesthesia Care Complications:            No immediate complications. Estimated Blood Loss:     Estimated blood loss: none. Procedure:                Pre-Anesthesia Assessment:                           - Prior to the procedure, a History and Physical                            was performed, and patient medications, allergies                            and sensitivities were reviewed. The patient's                            tolerance of previous anesthesia was reviewed.                           - The risks and benefits of the procedure and the                            sedation options and risks were discussed with the                            patient. All questions were answered and informed                            consent was obtained.                           After obtaining informed consent, the endoscope was                            passed under direct vision. Throughout the                            procedure, the patient's blood pressure, pulse, and                            oxygen saturations were monitored continuously. The  GIF-H190 ([id number] scope was introduced through the                            mouth, and advanced to the second part of duodenum.                            The upper GI endoscopy was accomplished without                            difficulty. The patient tolerated the procedure                            well. Scope In: 1:46:08 PM Scope Out:  1:50:40 PM Total Procedure Duration: 0 hours 4 minutes 32 seconds  Findings:      LA Grade C (one or more mucosal breaks continuous between tops of 2 or       more mucosal folds, less than 75% circumference) esophagitis with no       bleeding was found in the lower third of the esophagus.      An 8 cm hiatal hernia was found. The hiatal narrowing was 38 cm from the       incisors. The Z-line was 30 cm from the incisors.      The gastroesophageal flap valve was visualized endoscopically and       classified as Hill Grade IV (no fold, wide open lumen, hiatal hernia       present).      The stomach was normal. Biopsies were taken with a cold forceps for       Helicobacter pylori testing.      Patchy erythematous mucosa without active bleeding and with no stigmata       of bleeding was found in the duodenal bulb. Impression:               - LA Grade C reflux esophagitis with no bleeding.                           - 8 cm hiatal hernia.                           - Normal stomach. Biopsied.                           - Erythematous duodenopathy. Moderate Sedation:      Per Anesthesia Care Recommendation:           - Discharge patient to home (ambulatory).                           - Resume previous diet.                           - Use Prilosec (omeprazole) 40 mg PO daily.                           - Repeat upper endoscopy in 3 months for  surveillance.                           - Await pathology results. Procedure Code(s):        --- Professional ---                           [id number], Esophagogastroduodenoscopy, flexible,                            transoral; with biopsy, single or multiple Diagnosis Code(s):        --- Professional ---                           K21.00, Gastro-esophageal reflux disease with                            esophagitis, without bleeding                           K44.9, Diaphragmatic hernia without obstruction or                             gangrene                           K31.89, Other diseases of stomach and duodenum                           R12, Heartburn CPT copyright 2022 [employer]. All rights reserved. The codes documented in this report are preliminary and upon coder review may  be revised to meet current compliance requirements. Katrinka Blazing, MD Katrinka Blazing,  07/14/2022 1:59:16 PM This report has been signed electronically. Number of Addenda: 0

## 2022-07-14 NOTE — Transfer of Care (Signed)
Immediate Anesthesia Transfer of Care Note  Patient: Teresa Howe  Procedure(s) Performed: ESOPHAGOGASTRODUODENOSCOPY (EGD) WITH PROPOFOL BIOPSY  Patient Location: PACU and Short Stay  Anesthesia Type:General  Level of Consciousness: awake  Airway & Oxygen Therapy: Patient Spontanous Breathing  Post-op Assessment: Report given to RN  Post vital signs: Reviewed  Last Vitals:  Vitals Value Taken Time  BP 88/52 07/14/22 1357  Temp 36.7 C 07/14/22 1357  Pulse 75 07/14/22 1357  Resp 19 07/14/22 1357  SpO2 98 % 07/14/22 1357    Last Pain:  Vitals:   07/14/22 1357  TempSrc: Oral  PainSc:       Patients Stated Pain Goal: 5 (07/14/22 1224)  Complications: No notable events documented.

## 2022-07-14 NOTE — Discharge Instructions (Addendum)
You are being discharged to home.  Resume your previous diet.  Take Prilosec (omeprazole) 40 mg by mouth once a day.  Your physician has recommended a repeat upper endoscopy in three months for surveillance.  We are waiting for your pathology results.  Explained presumed etiology of reflux symptoms. Instruction provided in the use of antireflux medication - patient should take medication in the morning 30-45 minutes before eating breakfast. Discussed avoidance of eating within 2 hours of lying down to sleep and benefit of blocks to elevate head of bed. Also, will benefit from avoiding carbonated drinks/sodas or food that has tomatoes, spicy or greasy food.

## 2022-07-15 LAB — SURGICAL PATHOLOGY

## 2022-07-16 DIAGNOSIS — C44319 Basal cell carcinoma of skin of other parts of face: Secondary | ICD-10-CM | POA: Diagnosis not present

## 2022-07-17 NOTE — Anesthesia Postprocedure Evaluation (Signed)
Anesthesia Post Note  Patient: Teresa Howe  Procedure(s) Performed: ESOPHAGOGASTRODUODENOSCOPY (EGD) WITH PROPOFOL BIOPSY  Patient location during evaluation: Phase II Anesthesia Type: General Level of consciousness: awake Pain management: pain level controlled Vital Signs Assessment: post-procedure vital signs reviewed and stable Respiratory status: spontaneous breathing and respiratory function stable Cardiovascular status: blood pressure returned to baseline and stable Postop Assessment: no headache and no apparent nausea or vomiting Anesthetic complications: no Comments: Late entry   No notable events documented.   Last Vitals:  Vitals:   07/14/22 1357 07/14/22 1400  BP: (!) 88/52 98/69  Pulse: 75   Resp: 19   Temp: 36.7 C   SpO2: 98%     Last Pain:  Vitals:   07/14/22 1357  TempSrc: Oral  PainSc:                  [name]

## 2022-07-21 ENCOUNTER — Encounter (HOSPITAL_COMMUNITY): Payer: Self-pay | Admitting: Gastroenterology

## 2022-08-07 DIAGNOSIS — Z87891 Personal history of nicotine dependence: Secondary | ICD-10-CM | POA: Diagnosis not present

## 2022-08-07 DIAGNOSIS — J069 Acute upper respiratory infection, unspecified: Secondary | ICD-10-CM | POA: Diagnosis not present

## 2022-09-07 ENCOUNTER — Encounter (INDEPENDENT_AMBULATORY_CARE_PROVIDER_SITE_OTHER): Payer: Self-pay | Admitting: *Deleted

## 2022-10-06 ENCOUNTER — Other Ambulatory Visit (HOSPITAL_COMMUNITY): Payer: Self-pay | Admitting: Internal Medicine

## 2022-10-06 DIAGNOSIS — Z1231 Encounter for screening mammogram for malignant neoplasm of breast: Secondary | ICD-10-CM

## 2022-10-07 ENCOUNTER — Telehealth (INDEPENDENT_AMBULATORY_CARE_PROVIDER_SITE_OTHER): Payer: Self-pay | Admitting: *Deleted

## 2022-10-07 NOTE — Telephone Encounter (Signed)
Spoke to the patient, she understands why we have to do EGD. Please schedule her for in room 1. Diagnosis: esophagitis follow up Thanks

## 2022-10-07 NOTE — Telephone Encounter (Signed)
Patient was on recall for 3 month EGD, she received her letter in the mail and stopped by office questioning why she needed it so soon - please advise  Ph# [phone]

## 2022-10-09 NOTE — Telephone Encounter (Signed)
Pt contacted. Pt states she would like to have EGD completed in August. Placed note in my August folder to call when August schedule is out.

## 2022-10-22 DIAGNOSIS — E782 Mixed hyperlipidemia: Secondary | ICD-10-CM | POA: Diagnosis not present

## 2022-10-22 DIAGNOSIS — R7303 Prediabetes: Secondary | ICD-10-CM | POA: Diagnosis not present

## 2022-10-22 DIAGNOSIS — I10 Essential (primary) hypertension: Secondary | ICD-10-CM | POA: Diagnosis not present

## 2022-10-28 DIAGNOSIS — R7303 Prediabetes: Secondary | ICD-10-CM | POA: Diagnosis not present

## 2022-10-28 DIAGNOSIS — Z87891 Personal history of nicotine dependence: Secondary | ICD-10-CM | POA: Diagnosis not present

## 2022-10-28 DIAGNOSIS — N1831 Chronic kidney disease, stage 3a: Secondary | ICD-10-CM | POA: Diagnosis not present

## 2022-10-28 DIAGNOSIS — M810 Age-related osteoporosis without current pathological fracture: Secondary | ICD-10-CM | POA: Diagnosis not present

## 2022-10-28 DIAGNOSIS — I700 Atherosclerosis of aorta: Secondary | ICD-10-CM | POA: Diagnosis not present

## 2022-10-28 DIAGNOSIS — Z0000 Encounter for general adult medical examination without abnormal findings: Secondary | ICD-10-CM | POA: Diagnosis not present

## 2022-10-28 DIAGNOSIS — K219 Gastro-esophageal reflux disease without esophagitis: Secondary | ICD-10-CM | POA: Diagnosis not present

## 2022-10-28 DIAGNOSIS — K449 Diaphragmatic hernia without obstruction or gangrene: Secondary | ICD-10-CM | POA: Diagnosis not present

## 2022-10-28 DIAGNOSIS — J439 Emphysema, unspecified: Secondary | ICD-10-CM | POA: Diagnosis not present

## 2022-10-28 DIAGNOSIS — I129 Hypertensive chronic kidney disease with stage 1 through stage 4 chronic kidney disease, or unspecified chronic kidney disease: Secondary | ICD-10-CM | POA: Diagnosis not present

## 2022-10-28 DIAGNOSIS — E782 Mixed hyperlipidemia: Secondary | ICD-10-CM | POA: Diagnosis not present

## 2022-10-29 NOTE — Telephone Encounter (Signed)
Left message to return call 

## 2022-10-29 NOTE — Telephone Encounter (Signed)
Spoke with pt. She has been scheduled for EGD with Dr. Levon Hedger 8/2. Aware to arrival 10:00am [facility] main entrance. Discussed EGD instructions in detail over the phone with her. She voiced understanding.

## 2022-11-06 ENCOUNTER — Ambulatory Visit (HOSPITAL_COMMUNITY): Payer: Medicare HMO

## 2022-11-06 ENCOUNTER — Ambulatory Visit (HOSPITAL_COMMUNITY): Payer: Medicare HMO | Admitting: Certified Registered"

## 2022-11-06 ENCOUNTER — Encounter (HOSPITAL_COMMUNITY): Payer: Self-pay | Admitting: Gastroenterology

## 2022-11-06 ENCOUNTER — Ambulatory Visit (HOSPITAL_BASED_OUTPATIENT_CLINIC_OR_DEPARTMENT_OTHER): Payer: Medicare HMO | Admitting: Certified Registered"

## 2022-11-06 ENCOUNTER — Other Ambulatory Visit: Payer: Self-pay

## 2022-11-06 ENCOUNTER — Ambulatory Visit (HOSPITAL_COMMUNITY)
Admission: RE | Admit: 2022-11-06 | Discharge: 2022-11-06 | Disposition: A | Discharge status: Home / Self Care | Payer: Medicare HMO | Source: Home / Self Care | Attending: Gastroenterology | Admitting: Gastroenterology

## 2022-11-06 ENCOUNTER — Encounter (HOSPITAL_COMMUNITY)
Admission: RE | Disposition: A | Discharge status: Home / Self Care | Payer: Self-pay | Source: Home / Self Care | Attending: Gastroenterology

## 2022-11-06 DIAGNOSIS — N189 Chronic kidney disease, unspecified: Secondary | ICD-10-CM | POA: Insufficient documentation

## 2022-11-06 DIAGNOSIS — K449 Diaphragmatic hernia without obstruction or gangrene: Secondary | ICD-10-CM | POA: Insufficient documentation

## 2022-11-06 DIAGNOSIS — E785 Hyperlipidemia, unspecified: Secondary | ICD-10-CM | POA: Diagnosis not present

## 2022-11-06 DIAGNOSIS — Z87891 Personal history of nicotine dependence: Secondary | ICD-10-CM | POA: Insufficient documentation

## 2022-11-06 DIAGNOSIS — I129 Hypertensive chronic kidney disease with stage 1 through stage 4 chronic kidney disease, or unspecified chronic kidney disease: Secondary | ICD-10-CM | POA: Insufficient documentation

## 2022-11-06 DIAGNOSIS — R12 Heartburn: Secondary | ICD-10-CM | POA: Diagnosis not present

## 2022-11-06 DIAGNOSIS — K209 Esophagitis, unspecified without bleeding: Secondary | ICD-10-CM

## 2022-11-06 HISTORY — PX: ESOPHAGOGASTRODUODENOSCOPY (EGD) WITH PROPOFOL: SHX5813

## 2022-11-06 SURGERY — ESOPHAGOGASTRODUODENOSCOPY (EGD) WITH PROPOFOL
Anesthesia: General

## 2022-11-06 MED ORDER — LACTATED RINGERS IV SOLN: INTRAVENOUS | Status: DC

## 2022-11-06 MED ORDER — LACTATED RINGERS IV SOLN: INTRAVENOUS | Status: DC | PRN

## 2022-11-06 MED ORDER — PROPOFOL 10 MG/ML IV BOLUS
INTRAVENOUS | Status: DC | PRN
Start: 2022-11-06 — End: 2022-11-06
  Administered 2022-11-06: 60 mg via INTRAVENOUS

## 2022-11-06 MED ORDER — LIDOCAINE HCL (CARDIAC) PF 100 MG/5ML IV SOSY
PREFILLED_SYRINGE | INTRAVENOUS | Status: DC | PRN
  Administered 2022-11-06: 60 mg via INTRAVENOUS

## 2022-11-06 NOTE — Anesthesia Preprocedure Evaluation (Signed)
Anesthesia Evaluation  Patient identified by MRN, date of birth, ID band Patient awake    Reviewed: Allergy & Precautions, H&P , NPO status , Patient's Chart, lab work & pertinent test results, reviewed documented beta blocker date and time   Airway Mallampati: II  TM Distance: >3 FB Neck ROM: full    Dental no notable dental hx.    Pulmonary neg pulmonary ROS, former smoker   Pulmonary exam normal breath sounds clear to auscultation       Cardiovascular Exercise Tolerance: Good hypertension, negative cardio ROS  Rhythm:regular Rate:Normal     Neuro/Psych  Neuromuscular disease negative neurological ROS  negative psych ROS   GI/Hepatic negative GI ROS, Neg liver ROS, hiatal hernia,GERD  ,,  Endo/Other  negative endocrine ROS    Renal/GU negative Renal ROS  negative genitourinary   Musculoskeletal   Abdominal   Peds  Hematology negative hematology ROS (+)   Anesthesia Other Findings   Reproductive/Obstetrics negative OB ROS                              Anesthesia Physical Anesthesia Plan  ASA: 2  Anesthesia Plan: General   Post-op Pain Management:    Induction:   PONV Risk Score and Plan: Propofol infusion  Airway Management Planned:   Additional Equipment:   Intra-op Plan:   Post-operative Plan:   Informed Consent: I have reviewed the patients History and Physical, chart, labs and discussed the procedure including the risks, benefits and alternatives for the proposed anesthesia with the patient or authorized representative who has indicated his/her understanding and acceptance.     Dental Advisory Given  Plan Discussed with: CRNA  Anesthesia Plan Comments:         Anesthesia Quick Evaluation

## 2022-11-06 NOTE — Discharge Instructions (Addendum)
You are being discharged to home.  Resume your previous diet.  Continue Prilosec (omeprazole) 40 mg by mouth once a day.

## 2022-11-06 NOTE — Transfer of Care (Signed)
Immediate Anesthesia Transfer of Care Note  Patient: Teresa Howe  Procedure(s) Performed: ESOPHAGOGASTRODUODENOSCOPY (EGD) WITH PROPOFOL  Patient Location: PACU  Anesthesia Type:General  Level of Consciousness: awake, oriented, drowsy, and patient cooperative  Airway & Oxygen Therapy: Patient Spontanous Breathing and Patient connected to nasal cannula oxygen  Post-op Assessment: Report given to RN, Post -op Vital signs reviewed and stable, and Patient moving all extremities X 4  Post vital signs: Reviewed and stable  Last Vitals:  Vitals Value Taken Time  BP    Temp    Pulse    Resp    SpO2     VSS, vitals to be entered in by PACU RN.  Last Pain:  Vitals:   11/06/22 0734  TempSrc:   PainSc: 0-No pain      Patients Stated Pain Goal: 4 (11/06/22 0865)  Complications: No notable events documented.

## 2022-11-06 NOTE — H&P (Signed)
Teresa Howe is an 77 y.o. female.   Chief Complaint: GERD HPI: Teresa Howe is a 77 y.o. female with PHM GERD,HLD, HTN, CKD, who presents for evaluation of heartburn.   Patient reports her heartburn resolved and very rarely has to take antacids.  The patient denies having any nausea, vomiting, fever, chills, hematochezia, melena, hematemesis, abdominal distention, abdominal pain, diarrhea, jaundice, pruritus or weight loss.  No dysphagia.   Past Medical History:  Diagnosis Date   GERD (gastroesophageal reflux disease)    Hypercholesteremia    Hypertension     Past Surgical History:  Procedure Laterality Date   ABDOMINAL HYSTERECTOMY     BIOPSY  07/14/2022   Procedure: BIOPSY;  Surgeon: Dolores Frame, MD;  Location: [facility];  Service: Gastroenterology;;   CATARACT EXTRACTION W/PHACO Right 06/02/2021   Procedure: CATARACT EXTRACTION PHACO AND INTRAOCULAR LENS PLACEMENT (IOC);  Surgeon: Fabio Pierce, MD;  Location: [facility];  Service: Ophthalmology;  Laterality: Right;  CDE: 16.00   CATARACT EXTRACTION W/PHACO Left 06/16/2021   Procedure: CATARACT EXTRACTION PHACO AND INTRAOCULAR LENS PLACEMENT (IOC);  Surgeon: Fabio Pierce, MD;  Location: [facility];  Service: Ophthalmology;  Laterality: Left;  CDE:15.56   COLONOSCOPY N/A 03/01/2013   Procedure: COLONOSCOPY;  Surgeon: Malissa Hippo, MD;  Location: [facility];  Service: Endoscopy;  Laterality: N/A;  830-moved to 930 Ann to notify pt   COLONOSCOPY N/A 11/16/2018   Procedure: COLONOSCOPY;  Surgeon: Malissa Hippo, MD;  Location: [facility];  Service: Endoscopy;  Laterality: N/A;  930   ESOPHAGOGASTRODUODENOSCOPY (EGD) WITH PROPOFOL N/A 07/14/2022   Procedure: ESOPHAGOGASTRODUODENOSCOPY (EGD) WITH PROPOFOL;  Surgeon: Dolores Frame, MD;  Location: [facility];  Service: Gastroenterology;  Laterality: N/A;  2:00PM;ASA 1-2   POLYPECTOMY  11/16/2018   Procedure: POLYPECTOMY;  Surgeon: Malissa Hippo,  MD;  Location: [facility];  Service: Endoscopy;;  colon     Family History  Problem Relation Age of Onset   Colon cancer Father    Social History:  reports that she has quit smoking. Her smoking use included cigarettes. She has a 2 pack-year smoking history. She has never used smokeless tobacco. She reports that she does not currently use alcohol. She reports that she does not use drugs.  Allergies: No Known Allergies  Medications Prior to Admission  Medication Sig Dispense Refill   acetaminophen (TYLENOL) 325 MG tablet Take 650 mg by mouth every 6 (six) hours as needed for mild pain, moderate pain or headache.     alendronate (FOSAMAX) 70 MG tablet Take 70 mg by mouth every Tuesday.     atorvastatin (LIPITOR) 40 MG tablet Take 40 mg by mouth at bedtime.     Calcium Carb-Cholecalciferol (CALCIUM 600+D3 PO) Take 1 tablet by mouth every other day.     cholecalciferol (VITAMIN D3) 10 MCG (400 UNIT) TABS tablet Take 400 Units by mouth daily.     lisinopril (ZESTRIL) 20 MG tablet Take 20 mg by mouth daily.     Multiple Vitamin (MULTIVITAMIN WITH MINERALS) TABS tablet Take 1 tablet by mouth daily.     Omega-3 Fatty Acids (FISH OIL) 1000 MG CAPS Take 1,000 mg by mouth 3 (three) times daily.     omeprazole (PRILOSEC) 40 MG capsule Take 1 capsule (40 mg total) by mouth daily. 90 capsule 3   vitamin B-12 (CYANOCOBALAMIN) 100 MCG tablet Take 100 mcg by mouth daily.      No results found for this or  any previous visit (from the past 48 hour(s)). No results found.  Review of Systems  All other systems reviewed and are negative.   Blood pressure 127/63, pulse 64, temperature 97.9 F (36.6 C), temperature source Oral, resp. rate 19, height 4\' 11"  (1.499 m), weight 47.2 kg, SpO2 98%. Physical Exam  GENERAL: The patient is AO x3, in no acute distress. HEENT: Head is normocephalic and atraumatic. EOMI are intact. Mouth is well hydrated and without lesions. NECK: Supple. No masses LUNGS: Clear  to auscultation. No presence of rhonchi/wheezing/rales. Adequate chest expansion HEART: RRR, normal s1 and s2. ABDOMEN: Soft, nontender, no guarding, no peritoneal signs, and nondistended. BS +. No masses. EXTREMITIES: Without any cyanosis, clubbing, rash, lesions or edema. NEUROLOGIC: AOx3, no focal motor deficit. SKIN: no jaundice, no rashes  Assessment/Plan Teresa Howe is a 77 y.o. female with PHM GERD,HLD, HTN, CKD, who presents for evaluation of heartburn.  Will proceed with EGD.  Dolores Frame, MD 11/06/2022, 7:20 AM

## 2022-11-06 NOTE — Anesthesia Postprocedure Evaluation (Signed)
Anesthesia Post Note  Patient: Teresa Howe  Procedure(s) Performed: ESOPHAGOGASTRODUODENOSCOPY (EGD) WITH PROPOFOL  Patient location during evaluation: Phase II Anesthesia Type: General Level of consciousness: awake Pain management: pain level controlled Vital Signs Assessment: post-procedure vital signs reviewed and stable Respiratory status: spontaneous breathing and respiratory function stable Cardiovascular status: blood pressure returned to baseline and stable Postop Assessment: no headache and no apparent nausea or vomiting Anesthetic complications: no Comments: Late entry   No notable events documented.   Last Vitals:  Vitals:   11/06/22 0644 11/06/22 0745  BP: 127/63 114/63  Pulse: 64 60  Resp: 19 15  Temp: 36.6 C 36.5 C  SpO2: 98%     Last Pain:  Vitals:   11/06/22 0745  TempSrc: Oral  PainSc: 0-No pain                 [name]

## 2022-11-06 NOTE — Op Note (Signed)
[facility] Patient Name: Tiarah Shisler Procedure Date: 11/06/2022 7:11 AM MRN: [id number] Date of Birth: 07/25/1945 Attending MD: Katrinka Blazing , , [id number] CSN: [id number] Age: 77 Admit Type: Outpatient Procedure:                Upper GI endoscopy Indications:              Heartburn Providers:                Katrinka Blazing, Crystal Page, Zena Amos Referring MD:              Medicines:                Monitored Anesthesia Care Complications:            No immediate complications. Estimated Blood Loss:     Estimated blood loss: none. Procedure:                Pre-Anesthesia Assessment:                           - Prior to the procedure, a History and Physical                            was performed, and patient medications, allergies                            and sensitivities were reviewed. The patient's                            tolerance of previous anesthesia was reviewed.                           - The risks and benefits of the procedure and the                            sedation options and risks were discussed with the                            patient. All questions were answered and informed                            consent was obtained.                           - ASA Grade Assessment: II - A patient with mild                            systemic disease.                           After obtaining informed consent, the endoscope was                            passed under direct vision. Throughout the                            procedure, the patient's blood pressure, pulse, and  oxygen saturations were monitored continuously. The                            GIF-H190 ([id number]) scope was introduced through the                            mouth, and advanced to the second part of duodenum.                            The upper GI endoscopy was accomplished without                            difficulty. The patient tolerated the  procedure                            well. Scope In: 7:37:40 AM Scope Out: 7:41:24 AM Total Procedure Duration: 0 hours 3 minutes 44 seconds  Findings:      A 6 cm hiatal hernia was found. The hiatal narrowing was 35 cm from the       incisors. The Z-line was 29 cm from the incisors.      The stomach was normal.      The examined duodenum was normal. Impression:               - 6 cm hiatal hernia.                           - Normal stomach.                           - Normal examined duodenum.                           - No specimens collected. Moderate Sedation:      Per Anesthesia Care Recommendation:           - Discharge patient to home (ambulatory).                           - Resume previous diet.                           - Continue present medications. Procedure Code(s):        --- Professional ---                           [phone], Esophagogastroduodenoscopy, flexible,                            transoral; diagnostic, including collection of                            specimen(s) by brushing or washing, when performed                            (separate procedure) Diagnosis Code(s):        --- Professional ---  K44.9, Diaphragmatic hernia without obstruction or                            gangrene                           R12, Heartburn CPT copyright 2022 American Medical Association. All rights reserved. The codes documented in this report are preliminary and upon coder review may  be revised to meet current compliance requirements. Katrinka Blazing, MD Katrinka Blazing,  11/06/2022 7:51:23 AM This report has been signed electronically. Number of Addenda: 0

## 2022-11-11 ENCOUNTER — Encounter (HOSPITAL_COMMUNITY): Payer: Self-pay | Admitting: Gastroenterology

## 2022-11-12 ENCOUNTER — Ambulatory Visit (HOSPITAL_COMMUNITY)
Admission: RE | Admit: 2022-11-12 | Discharge: 2022-11-12 | Disposition: A | Discharge status: Home / Self Care | Payer: Medicare HMO | Source: Ambulatory Visit | Attending: Internal Medicine | Admitting: Internal Medicine

## 2022-11-12 DIAGNOSIS — Z1231 Encounter for screening mammogram for malignant neoplasm of breast: Secondary | ICD-10-CM | POA: Diagnosis not present

## 2022-12-29 DIAGNOSIS — L570 Actinic keratosis: Secondary | ICD-10-CM | POA: Diagnosis not present

## 2023-02-28 IMAGING — MG MM DIGITAL SCREENING BILAT W/ TOMO AND CAD
8 series · 9 of 24 positions shown · non-contrast
Comparison: Previous exam(s).

CLINICAL DATA: Screening.

EXAM:
DIGITAL SCREENING BILATERAL MAMMOGRAM WITH TOMOSYNTHESIS AND CAD
TECHNIQUE: Bilateral screening digital craniocaudal and mediolateral oblique
mammograms were obtained. Bilateral screening digital breast
tomosynthesis was performed. The images were evaluated with
computer-aided detection.

[R MLO synth-2D]
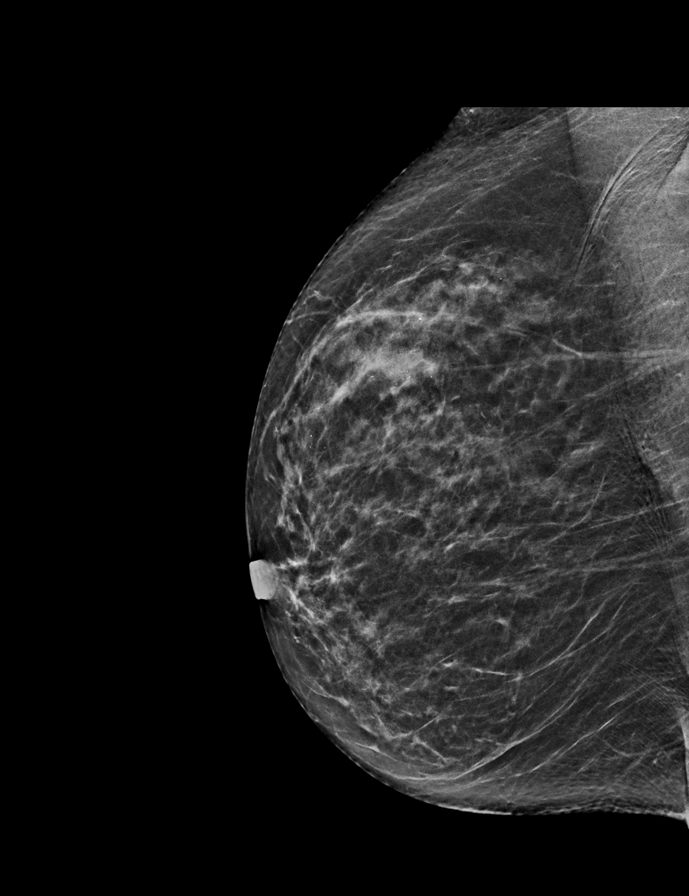

[L MLO synth-2D]
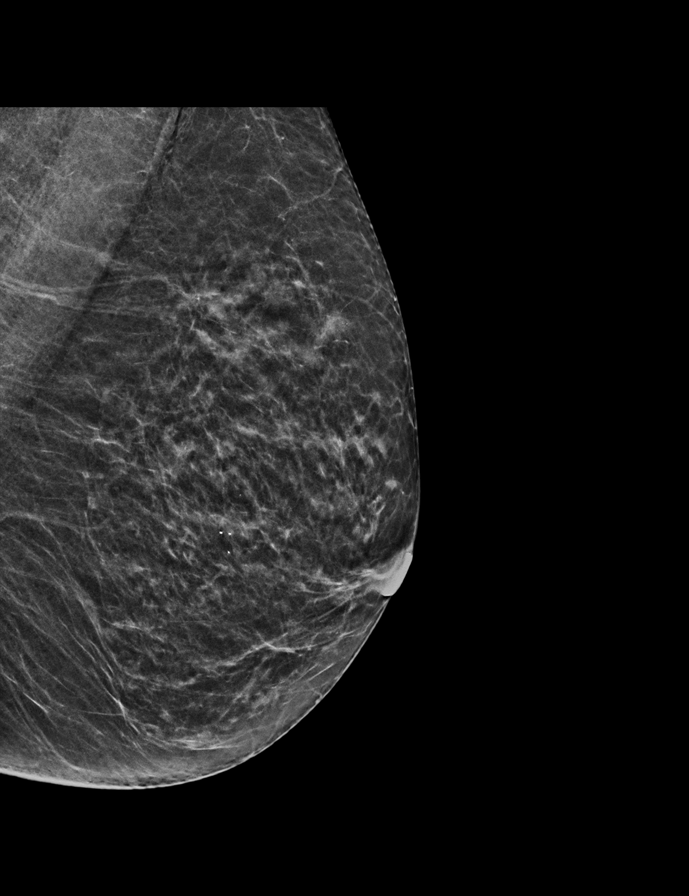

[R CC synth-2D]
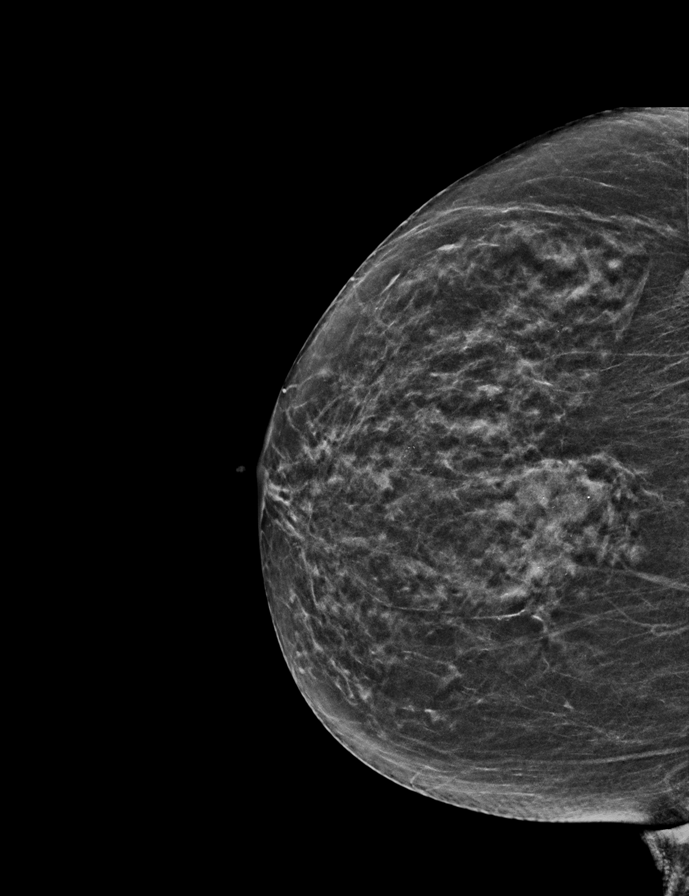

[L CC synth-2D]
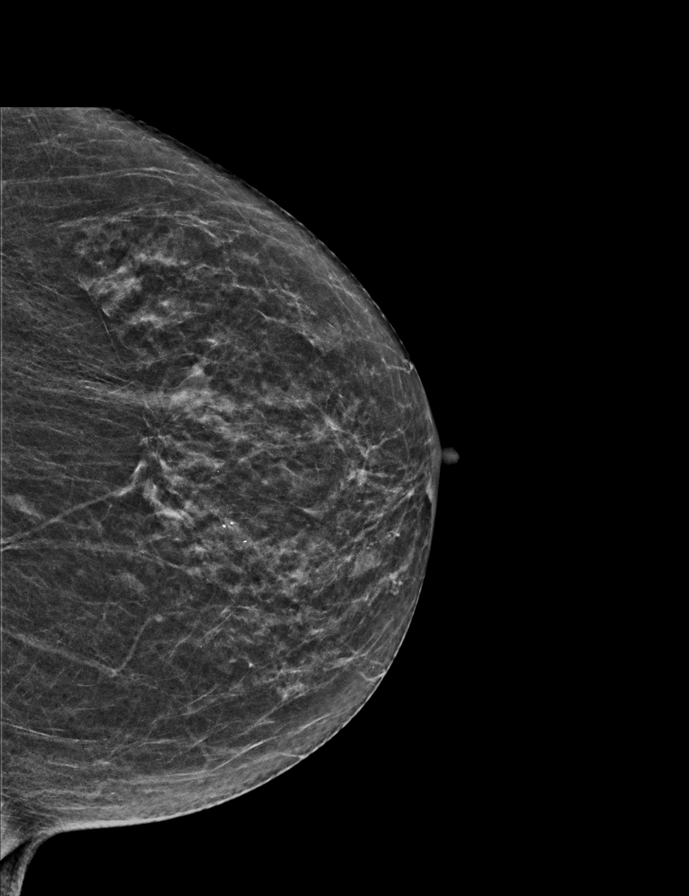

[L MLO tomo · 2 of 47 frames shown]
[frame 16/47]
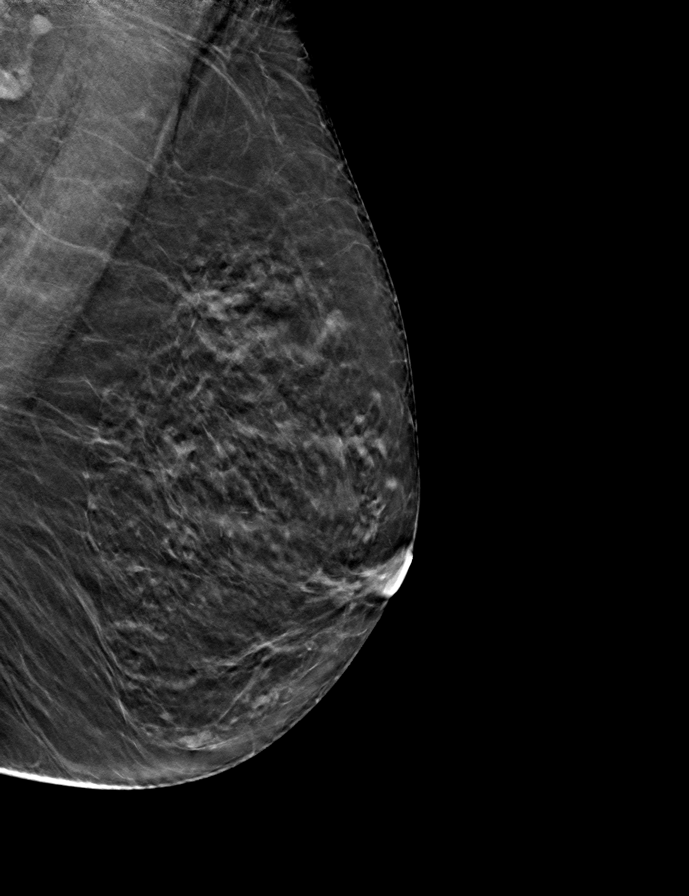
[frame 24/47]
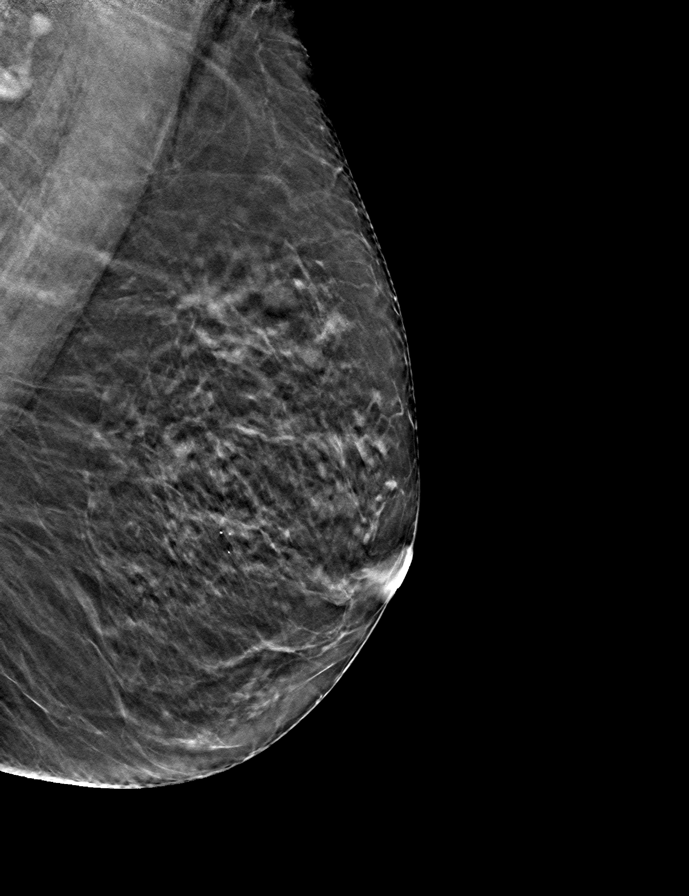

[R MLO tomo · tomo slice 26/51.0]
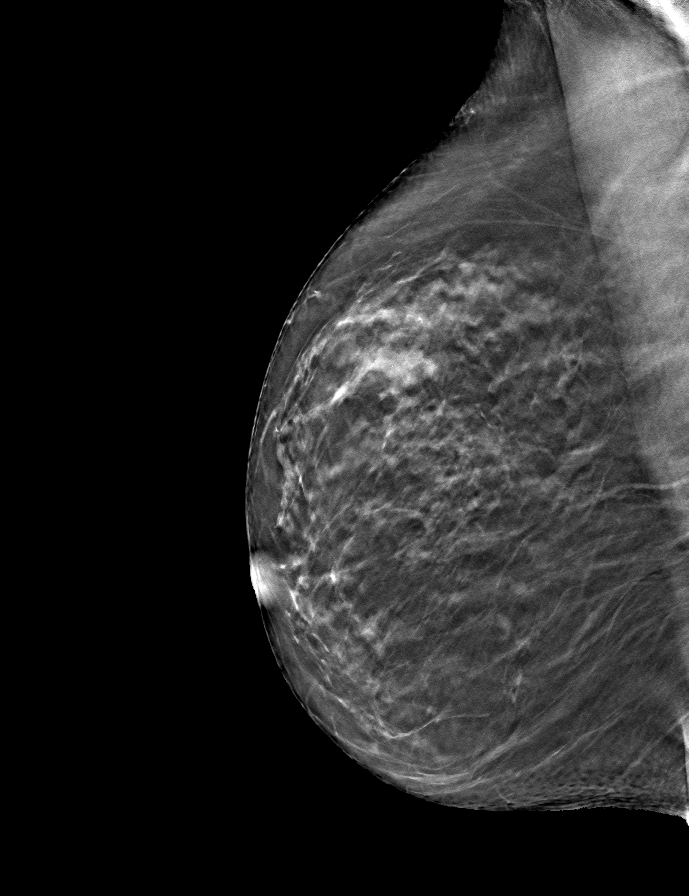

[R CC tomo · tomo slice 23/45.0]
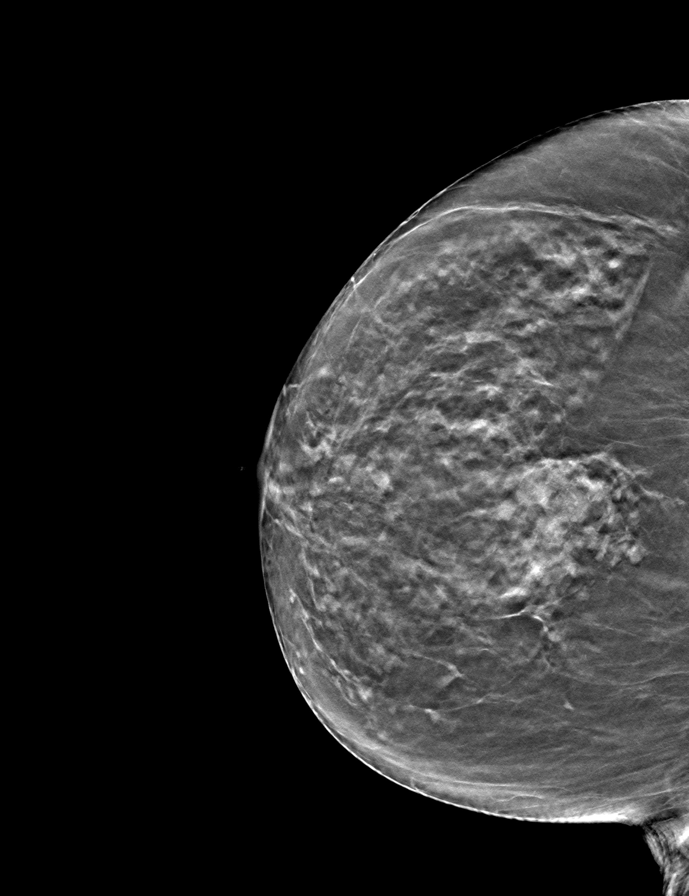

[L CC tomo · tomo slice 23/46.0]
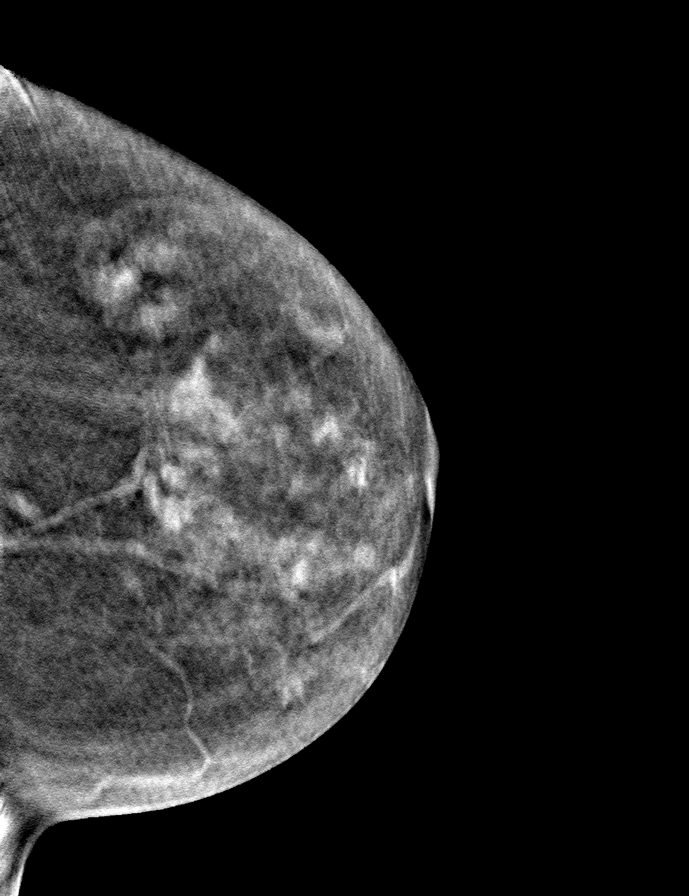

[9 of 24 positions shown; findings below may reference images not displayed]

ACR Breast Density Category b: There are scattered areas of
fibroglandular density.
FINDINGS: In the left breast, possible distortion in the posterior 12 o'clock
position of the breast warrants further evaluation. In the right
breast, no findings suspicious for malignancy.
IMPRESSION: Further evaluation is suggested for possible distortion in the left
breast.

RECOMMENDATION:
Diagnostic mammogram and possibly ultrasound of the left breast.
(Code:DT-Y-YY7)

The patient will be contacted regarding the findings, and additional
imaging will be scheduled.

BI-RADS CATEGORY  0: Incomplete. Need additional imaging evaluation
and/or prior mammograms for comparison.

## 2023-04-28 DIAGNOSIS — R7303 Prediabetes: Secondary | ICD-10-CM | POA: Diagnosis not present

## 2023-04-28 DIAGNOSIS — E782 Mixed hyperlipidemia: Secondary | ICD-10-CM | POA: Diagnosis not present

## 2023-04-28 DIAGNOSIS — I10 Essential (primary) hypertension: Secondary | ICD-10-CM | POA: Diagnosis not present

## 2023-05-05 ENCOUNTER — Other Ambulatory Visit (HOSPITAL_COMMUNITY): Payer: Self-pay | Admitting: Nurse Practitioner

## 2023-05-05 DIAGNOSIS — R159 Full incontinence of feces: Secondary | ICD-10-CM

## 2023-05-05 DIAGNOSIS — Z13820 Encounter for screening for osteoporosis: Secondary | ICD-10-CM

## 2023-05-07 ENCOUNTER — Ambulatory Visit (HOSPITAL_COMMUNITY)
Admission: RE | Admit: 2023-05-07 | Discharge: 2023-05-07 | Disposition: A | Discharge status: Home / Self Care | Payer: Medicare HMO | Source: Ambulatory Visit | Attending: Nurse Practitioner | Admitting: Nurse Practitioner

## 2023-05-07 DIAGNOSIS — N1831 Chronic kidney disease, stage 3a: Secondary | ICD-10-CM | POA: Diagnosis not present

## 2023-05-07 DIAGNOSIS — R159 Full incontinence of feces: Secondary | ICD-10-CM | POA: Insufficient documentation

## 2023-05-07 DIAGNOSIS — R152 Fecal urgency: Secondary | ICD-10-CM | POA: Diagnosis not present

## 2023-05-12 ENCOUNTER — Encounter (INDEPENDENT_AMBULATORY_CARE_PROVIDER_SITE_OTHER): Payer: Self-pay | Admitting: *Deleted

## 2023-05-14 ENCOUNTER — Other Ambulatory Visit (HOSPITAL_COMMUNITY): Payer: Self-pay | Admitting: Nurse Practitioner

## 2023-05-14 DIAGNOSIS — R9389 Abnormal findings on diagnostic imaging of other specified body structures: Secondary | ICD-10-CM

## 2023-05-31 ENCOUNTER — Ambulatory Visit (HOSPITAL_COMMUNITY)
Admission: RE | Admit: 2023-05-31 | Discharge: 2023-05-31 | Disposition: A | Discharge status: Home / Self Care | Payer: Medicare HMO | Source: Ambulatory Visit | Attending: Nurse Practitioner | Admitting: Nurse Practitioner

## 2023-05-31 DIAGNOSIS — R9389 Abnormal findings on diagnostic imaging of other specified body structures: Secondary | ICD-10-CM | POA: Insufficient documentation

## 2023-05-31 DIAGNOSIS — M898X8 Other specified disorders of bone, other site: Secondary | ICD-10-CM | POA: Diagnosis not present

## 2023-06-14 ENCOUNTER — Encounter (INDEPENDENT_AMBULATORY_CARE_PROVIDER_SITE_OTHER): Payer: Self-pay | Admitting: Gastroenterology

## 2023-06-14 ENCOUNTER — Ambulatory Visit (INDEPENDENT_AMBULATORY_CARE_PROVIDER_SITE_OTHER): Payer: Medicare HMO | Admitting: Gastroenterology

## 2023-06-14 VITALS — BP 149/90 | HR 70 | Temp 98.4°F | Ht 59.0 in | Wt 113.6 lb

## 2023-06-14 DIAGNOSIS — A048 Other specified bacterial intestinal infections: Secondary | ICD-10-CM

## 2023-06-14 DIAGNOSIS — R159 Full incontinence of feces: Secondary | ICD-10-CM | POA: Insufficient documentation

## 2023-06-14 DIAGNOSIS — R151 Fecal smearing: Secondary | ICD-10-CM

## 2023-06-14 DIAGNOSIS — B9681 Helicobacter pylori [H. pylori] as the cause of diseases classified elsewhere: Secondary | ICD-10-CM | POA: Insufficient documentation

## 2023-06-14 MED ORDER — OMEPRAZOLE 40 MG PO CPDR
40.0000 mg | DELAYED_RELEASE_CAPSULE | Freq: Every day | ORAL | 3 refills | Status: AC
Start: 2023-06-14 — End: ?

## 2023-06-14 MED ORDER — TETRACYCLINE HCL 500 MG PO CAPS
500.0000 mg | ORAL_CAPSULE | Freq: Four times a day (QID) | ORAL | 0 refills | Status: AC
Start: 2023-06-14 — End: 2023-06-28

## 2023-06-14 MED ORDER — BISMUTH SUBSALICYLATE 262 MG PO CAPS
2.0000 | ORAL_CAPSULE | Freq: Four times a day (QID) | ORAL | 0 refills | Status: AC
Start: 2023-06-14 — End: 2023-06-28

## 2023-06-14 MED ORDER — METRONIDAZOLE 500 MG PO TABS: 500.0000 mg | ORAL_TABLET | Freq: Two times a day (BID) | ORAL | 0 refills | Status: DC

## 2023-06-14 NOTE — Patient Instructions (Signed)
 Start Benefiber fiber supplements 1 to 2 tablespoons daily to increase stool bulk If persistent issues with bowel movements, we will need to start MiraLAX Screening colonoscopy in August 2025 Take antibiotic course for 2 weeks

## 2023-06-14 NOTE — Progress Notes (Signed)
 Teresa Howe, M.D. Gastroenterology & Hepatology [facility] [address] [place], [place] [address]  Primary Care Physician: Benita Stabile, MD [address] [place] [address]  I will communicate my assessment and recommendations to the referring MD via EMR.  Problems: Incontinence and urgency Hiatal hernia GERD History of untreated H. pylori  History of Present Illness: Teresa Howe is a 78 y.o. female  with PHM GERD,HLD, HTN, CKD, who presents for evaluation of incontinence.  The patient was last seen on 07/06/2022. At that time, the patient was scheduled to undergo EGD.  CBC and CMP were checked which were within normal limits.  Patient reports that she has had changes in her bowel movements for the last year. She reports that she has noticed that depending on the type of food that she eats  (for example pizza), her stool can change to a [place] 1 , which can fluctuate to a [place] 3. She has some episodes of "seeping stool", for which she has had to change her pad as it keeps coming out during the day. No nighttime episodes.  The patient denies having any nausea, regular vomiting, fever, chills, hematochezia, melena, hematemesis, abdominal distention, abdominal pain, diarrhea, jaundice, pruritus or weight loss.  Had two x-rays of her abdomen and pelvis performed in January and February 2025.  There was no presence of colonic loop distention but upon my personal review there was a fair amount of stool.  No other changes.  There was presence of a lesion in the patient's pelvis bony structures, which she is following with her PCP.  Last EGD: 11/06/2022 Sick centimeter hiatal hernia, normal stomach and duodenum.  Notably, previous pathology showed presence of H. pylori but this did not receive treatment.  Last Colonoscopy: 11/2018 Dicerticulosis One 4 mm polyp in sigmoid colon path  TA Recommended to repeat in 5  years.  Past Medical History: Past Medical History:  Diagnosis Date   GERD (gastroesophageal reflux disease)    Hypercholesteremia    Hypertension     Past Surgical History: Past Surgical History:  Procedure Laterality Date   ABDOMINAL HYSTERECTOMY     BIOPSY  07/14/2022   Procedure: BIOPSY;  Surgeon: Dolores Frame, MD;  [facility];  Service: Gastroenterology;;   CATARACT EXTRACTION W/PHACO Right 06/02/2021   Procedure: CATARACT EXTRACTION PHACO AND INTRAOCULAR LENS PLACEMENT (IOC);  Surgeon: Fabio Pierce, MD;  Location: [facility];  Service: Ophthalmology;  Laterality: Right;  CDE: 16.00   CATARACT EXTRACTION W/PHACO Left 06/16/2021   Procedure: CATARACT EXTRACTION PHACO AND INTRAOCULAR LENS PLACEMENT (IOC);  Surgeon: Fabio Pierce, MD;  Location: [facility];  Service: Ophthalmology;  Laterality: Left;  CDE:15.56   COLONOSCOPY N/A 03/01/2013   Procedure: COLONOSCOPY;  Surgeon: Malissa Hippo, MD;  [facility];  Service: Endoscopy;  Laterality: N/A;  830-moved to 930 Ann to notify pt   COLONOSCOPY N/A 11/16/2018   Procedure: COLONOSCOPY;  Surgeon: Malissa Hippo, MD;  [facility];  Service: Endoscopy;  Laterality: N/A;  930   ESOPHAGOGASTRODUODENOSCOPY (EGD) WITH PROPOFOL N/A 07/14/2022   Procedure: ESOPHAGOGASTRODUODENOSCOPY (EGD) WITH PROPOFOL;  Surgeon: Dolores Frame, MD;  [facility];  Service: Gastroenterology;  Laterality: N/A;  2:00PM;ASA 1-2   ESOPHAGOGASTRODUODENOSCOPY (EGD) WITH PROPOFOL N/A 11/06/2022   Procedure: ESOPHAGOGASTRODUODENOSCOPY (EGD) WITH PROPOFOL;  Surgeon: Dolores Frame, MD;  [facility];  Service: Gastroenterology;  Laterality: N/A;  11:30AM, ASA 2  POLYPECTOMY  11/16/2018   Procedure: POLYPECTOMY;  Surgeon: Malissa Hippo, MD;  [facility];  Service: Endoscopy;;  colon     Family History: Family History  Problem Relation Age of Onset   Colon cancer Father      Social History: Social History   Tobacco Use  Smoking Status Former   Current packs/day: 0.50   Average packs/day: 0.5 packs/day for 4.0 years (2.0 ttl pk-yrs)   Types: Cigarettes  Smokeless Tobacco Never   Social History   Substance and Sexual Activity  Alcohol Use Not Currently   Social History   Substance and Sexual Activity  Drug Use Never    Allergies: No Known Allergies  Medications: Current Outpatient Medications  Medication Sig Dispense Refill   acetaminophen (TYLENOL) 325 MG tablet Take 650 mg by mouth every 6 (six) hours as needed for mild pain, moderate pain or headache.     alendronate (FOSAMAX) 70 MG tablet Take 70 mg by mouth every Tuesday.     atorvastatin (LIPITOR) 40 MG tablet Take 40 mg by mouth at bedtime.     Calcium Carb-Cholecalciferol (CALCIUM 600+D3 PO) Take 1 tablet by mouth every other day.     cholecalciferol (VITAMIN D3) 10 MCG (400 UNIT) TABS tablet Take 400 Units by mouth daily.     lisinopril (ZESTRIL) 20 MG tablet Take 20 mg by mouth daily.     Multiple Vitamin (MULTIVITAMIN WITH MINERALS) TABS tablet Take 1 tablet by mouth daily.     Omega-3 Fatty Acids (FISH OIL) 1000 MG CAPS Take 1,000 mg by mouth 3 (three) times daily.     omeprazole (PRILOSEC) 40 MG capsule Take 1 capsule (40 mg total) by mouth daily. 90 capsule 3   vitamin B-12 (CYANOCOBALAMIN) 100 MCG tablet Take 100 mcg by mouth daily.     No current facility-administered medications for this visit.    Review of Systems: GENERAL: negative for malaise, night sweats HEENT: No changes in hearing or vision, no nose bleeds or other nasal problems. NECK: Negative for lumps, goiter, pain and significant neck swelling RESPIRATORY: Negative for cough, wheezing CARDIOVASCULAR: Negative for chest pain, leg swelling, palpitations, orthopnea GI: SEE HPI MUSCULOSKELETAL: Negative for joint pain or swelling, back pain, and muscle pain. SKIN: Negative for lesions, rash PSYCH: Negative  for sleep disturbance, mood disorder and recent psychosocial stressors. HEMATOLOGY Negative for prolonged bleeding, bruising easily, and swollen nodes. ENDOCRINE: Negative for cold or heat intolerance, polyuria, polydipsia and goiter. NEURO: negative for tremor, gait imbalance, syncope and seizures. The remainder of the review of systems is noncontributory.   Physical Exam: BP (!) 149/90 (BP Location: Right Arm, Patient Position: Sitting, Cuff Size: Normal)   Pulse 70   Temp 98.4 F (36.9 C) (Oral)   Ht 4\' 11"  (1.499 m)   Wt 113 lb 9.6 oz (51.5 kg)   BMI 22.94 kg/m  GENERAL: The patient is AO x3, in no acute distress. HEENT: Head is normocephalic and atraumatic. EOMI are intact. Mouth is well hydrated and without lesions. NECK: Supple. No masses LUNGS: Clear to auscultation. No presence of rhonchi/wheezing/rales. Adequate chest expansion HEART: RRR, normal s1 and s2. ABDOMEN: Soft, nontender, no guarding, no peritoneal signs, and nondistended. BS +. No masses. EXTREMITIES: Without any cyanosis, clubbing, rash, lesions or edema. NEUROLOGIC: AOx3, no focal motor deficit. SKIN: no jaundice, no rashes  Imaging/Labs: as above  I personally reviewed and interpreted the available labs, imaging and endoscopic files.  Impression and Plan: WYNETTE JERSEY is  a 78 y.o. female  with PHM GERD,HLD, HTN, CKD, who presents for evaluation of incontinence.  Patient reports that she has resented episodes of fecal soiling without any red flag signs.  These episodes are only happening during the daytime and seem to be aggravated when her stools have a harder and pebble-like consistency.  I suspect she is having some overflow incontinence.  We will start her on fiber supplements now but if she fails to have an improvement, she will be started on MiraLAX regimen.  Notably, her first EGD biopsies showed presence of H. pylori which was not treated.  We discussed the risk of cancer with H. pylori.  She  will like to get this treated.  Will send quadruple bismuth regimen.  -Start Benefiber fiber supplements 1 to 2 tablespoons daily to increase stool bulk -If persistent issues with bowel movements, we will need to start MiraLAX -Screening colonoscopy in August 2025 -Take quadruple bismuth regimen with omeprazole twice daily, then continue with omeprazole daily afterwards  All questions were answered.      Teresa Blazing, MD Gastroenterology and Hepatology [facility]

## 2023-06-22 DIAGNOSIS — L570 Actinic keratosis: Secondary | ICD-10-CM | POA: Diagnosis not present

## 2023-07-28 ENCOUNTER — Ambulatory Visit (HOSPITAL_COMMUNITY)
Admission: RE | Admit: 2023-07-28 | Discharge: 2023-07-28 | Disposition: A | Discharge status: Home / Self Care | Payer: Medicare HMO | Source: Ambulatory Visit | Attending: Nurse Practitioner | Admitting: Nurse Practitioner

## 2023-07-28 DIAGNOSIS — Z13820 Encounter for screening for osteoporosis: Secondary | ICD-10-CM | POA: Insufficient documentation

## 2023-07-28 DIAGNOSIS — M8589 Other specified disorders of bone density and structure, multiple sites: Secondary | ICD-10-CM | POA: Insufficient documentation

## 2023-07-28 DIAGNOSIS — Z780 Asymptomatic menopausal state: Secondary | ICD-10-CM | POA: Insufficient documentation

## 2023-08-06 DIAGNOSIS — E1165 Type 2 diabetes mellitus with hyperglycemia: Secondary | ICD-10-CM | POA: Diagnosis not present

## 2023-08-06 DIAGNOSIS — E782 Mixed hyperlipidemia: Secondary | ICD-10-CM | POA: Diagnosis not present

## 2023-08-12 DIAGNOSIS — I10 Essential (primary) hypertension: Secondary | ICD-10-CM | POA: Diagnosis not present

## 2023-08-12 DIAGNOSIS — M810 Age-related osteoporosis without current pathological fracture: Secondary | ICD-10-CM | POA: Diagnosis not present

## 2023-08-12 DIAGNOSIS — I129 Hypertensive chronic kidney disease with stage 1 through stage 4 chronic kidney disease, or unspecified chronic kidney disease: Secondary | ICD-10-CM | POA: Diagnosis not present

## 2023-08-12 DIAGNOSIS — E1165 Type 2 diabetes mellitus with hyperglycemia: Secondary | ICD-10-CM | POA: Diagnosis not present

## 2023-08-12 DIAGNOSIS — I700 Atherosclerosis of aorta: Secondary | ICD-10-CM | POA: Diagnosis not present

## 2023-08-12 DIAGNOSIS — J439 Emphysema, unspecified: Secondary | ICD-10-CM | POA: Diagnosis not present

## 2023-08-12 DIAGNOSIS — E782 Mixed hyperlipidemia: Secondary | ICD-10-CM | POA: Diagnosis not present

## 2023-08-12 DIAGNOSIS — K449 Diaphragmatic hernia without obstruction or gangrene: Secondary | ICD-10-CM | POA: Diagnosis not present

## 2023-08-12 DIAGNOSIS — Z87891 Personal history of nicotine dependence: Secondary | ICD-10-CM | POA: Diagnosis not present

## 2023-08-12 DIAGNOSIS — K219 Gastro-esophageal reflux disease without esophagitis: Secondary | ICD-10-CM | POA: Diagnosis not present

## 2023-08-12 DIAGNOSIS — R152 Fecal urgency: Secondary | ICD-10-CM | POA: Diagnosis not present

## 2023-08-12 DIAGNOSIS — N1831 Chronic kidney disease, stage 3a: Secondary | ICD-10-CM | POA: Diagnosis not present

## 2023-08-31 ENCOUNTER — Other Ambulatory Visit (HOSPITAL_COMMUNITY): Payer: Self-pay | Admitting: Nurse Practitioner

## 2023-08-31 ENCOUNTER — Encounter (HOSPITAL_COMMUNITY): Payer: Self-pay | Admitting: Nurse Practitioner

## 2023-08-31 DIAGNOSIS — R9389 Abnormal findings on diagnostic imaging of other specified body structures: Secondary | ICD-10-CM

## 2023-10-22 ENCOUNTER — Encounter (INDEPENDENT_AMBULATORY_CARE_PROVIDER_SITE_OTHER): Payer: Self-pay | Admitting: *Deleted

## 2023-10-26 DIAGNOSIS — Z008 Encounter for other general examination: Secondary | ICD-10-CM | POA: Diagnosis not present

## 2023-10-27 ENCOUNTER — Other Ambulatory Visit: Payer: Self-pay | Admitting: Internal Medicine

## 2023-10-27 DIAGNOSIS — Z1231 Encounter for screening mammogram for malignant neoplasm of breast: Secondary | ICD-10-CM

## 2023-11-15 ENCOUNTER — Ambulatory Visit (HOSPITAL_COMMUNITY)
Admission: RE | Admit: 2023-11-15 | Discharge: 2023-11-15 | Disposition: A | Discharge status: Home / Self Care | Source: Ambulatory Visit | Attending: Internal Medicine | Admitting: Internal Medicine

## 2023-11-15 DIAGNOSIS — Z1231 Encounter for screening mammogram for malignant neoplasm of breast: Secondary | ICD-10-CM | POA: Diagnosis not present

## 2023-11-15 DIAGNOSIS — E1165 Type 2 diabetes mellitus with hyperglycemia: Secondary | ICD-10-CM | POA: Diagnosis not present

## 2023-11-15 DIAGNOSIS — N1831 Chronic kidney disease, stage 3a: Secondary | ICD-10-CM | POA: Diagnosis not present

## 2023-11-15 DIAGNOSIS — E782 Mixed hyperlipidemia: Secondary | ICD-10-CM | POA: Diagnosis not present

## 2023-11-16 ENCOUNTER — Telehealth: Payer: Self-pay

## 2023-11-16 NOTE — Telephone Encounter (Signed)
 Who is your primary care physician: Dr.Zach Shona  Reasons for the colonoscopy: screening  Have you had a colonoscopy before?  Yes 11/16/2018 Dr.Rehman  Do you have family history of colon cancer? no  Previous colonoscopy with polyps removed? yes  Do you have a history colorectal cancer?   no  Are you diabetic? If yes, Type 1 or Type 2?    no  Do you have a prosthetic or mechanical heart valve? no  Do you have a pacemaker/defibrillator?   no  Have you had endocarditis/atrial fibrillation? no  Have you had joint replacement within the last 12 months?  no  Do you tend to be constipated or have to use laxatives? no  Do you have any history of drugs or alchohol?  no  Do you use supplemental oxygen?  no  Have you had a stroke or heart attack within the last 6 months? no  Do you take weight loss medication?  no  For female patients: have you had a hysterectomy?  yes                                     are you post menopausal?       yes                                            do you still have your menstrual cycle? no      Do you take any blood-thinning medications such as: (aspirin, warfarin, Plavix, Aggrenox)  no  If yes we need the name, milligram, dosage and who is prescribing doctor  Current Outpatient Medications on File Prior to Visit  Medication Sig Dispense Refill   atorvastatin (LIPITOR) 40 MG tablet Take 40 mg by mouth at bedtime.     lisinopril (ZESTRIL) 20 MG tablet Take 20 mg by mouth daily.     omeprazole  (PRILOSEC) 40 MG capsule Take 1 capsule (40 mg total) by mouth daily. 90 capsule 3   No current facility-administered medications on file prior to visit.    No Known Allergies   Pharmacy: [facility]  Primary Insurance Name: [employer] [id number]  Best number where you can be reached: [phone]

## 2023-11-19 DIAGNOSIS — M810 Age-related osteoporosis without current pathological fracture: Secondary | ICD-10-CM | POA: Diagnosis not present

## 2023-11-19 DIAGNOSIS — N1831 Chronic kidney disease, stage 3a: Secondary | ICD-10-CM | POA: Diagnosis not present

## 2023-11-19 DIAGNOSIS — I10 Essential (primary) hypertension: Secondary | ICD-10-CM | POA: Diagnosis not present

## 2023-11-19 DIAGNOSIS — K449 Diaphragmatic hernia without obstruction or gangrene: Secondary | ICD-10-CM | POA: Diagnosis not present

## 2023-11-19 DIAGNOSIS — E1165 Type 2 diabetes mellitus with hyperglycemia: Secondary | ICD-10-CM | POA: Diagnosis not present

## 2023-11-19 DIAGNOSIS — E782 Mixed hyperlipidemia: Secondary | ICD-10-CM | POA: Diagnosis not present

## 2023-11-19 DIAGNOSIS — R152 Fecal urgency: Secondary | ICD-10-CM | POA: Diagnosis not present

## 2023-11-19 DIAGNOSIS — K219 Gastro-esophageal reflux disease without esophagitis: Secondary | ICD-10-CM | POA: Diagnosis not present

## 2023-11-19 DIAGNOSIS — I700 Atherosclerosis of aorta: Secondary | ICD-10-CM | POA: Diagnosis not present

## 2023-11-19 DIAGNOSIS — J439 Emphysema, unspecified: Secondary | ICD-10-CM | POA: Diagnosis not present

## 2023-11-19 DIAGNOSIS — K5909 Other constipation: Secondary | ICD-10-CM | POA: Diagnosis not present

## 2023-11-19 DIAGNOSIS — I129 Hypertensive chronic kidney disease with stage 1 through stage 4 chronic kidney disease, or unspecified chronic kidney disease: Secondary | ICD-10-CM | POA: Diagnosis not present

## 2023-11-22 NOTE — Telephone Encounter (Signed)
 Room 1 Thanks

## 2023-11-23 MED ORDER — PEG 3350-KCL-NA BICARB-NACL 420 G PO SOLR
4000.0000 mL | Freq: Once | ORAL | 0 refills | Status: AC
Start: 2023-11-23 — End: 2023-11-23

## 2023-11-23 NOTE — Telephone Encounter (Signed)
 Questionnaire from recall, no referral needed

## 2023-11-23 NOTE — Addendum Note (Signed)
 Addended by: JEANELL GRAEME RAMAN on: 11/23/2023 09:11 AM   Modules accepted: Orders

## 2023-11-23 NOTE — Telephone Encounter (Signed)
 SPOKE WITH PT. She has been scheduled for 9/3. Aware will mail instructions, rx for prep sent to pharmacy,

## 2023-11-23 NOTE — Telephone Encounter (Signed)
 Patient called in. She needed to r/s procedure. Moved to 9/8. Message sent to endo and new instructions also sent.

## 2023-12-09 DIAGNOSIS — H43812 Vitreous degeneration, left eye: Secondary | ICD-10-CM | POA: Diagnosis not present

## 2023-12-13 ENCOUNTER — Other Ambulatory Visit: Payer: Self-pay

## 2023-12-13 ENCOUNTER — Ambulatory Visit (HOSPITAL_COMMUNITY)

## 2023-12-13 ENCOUNTER — Encounter (INDEPENDENT_AMBULATORY_CARE_PROVIDER_SITE_OTHER): Payer: Self-pay | Admitting: *Deleted

## 2023-12-13 ENCOUNTER — Encounter (HOSPITAL_COMMUNITY)
Admission: RE | Disposition: A | Discharge status: Home / Self Care | Payer: Self-pay | Source: Home / Self Care | Attending: Gastroenterology

## 2023-12-13 ENCOUNTER — Ambulatory Visit (HOSPITAL_COMMUNITY)
Admission: RE | Admit: 2023-12-13 | Discharge: 2023-12-13 | Disposition: A | Discharge status: Home / Self Care | Attending: Gastroenterology | Admitting: Gastroenterology

## 2023-12-13 ENCOUNTER — Encounter (HOSPITAL_COMMUNITY): Payer: Self-pay | Admitting: Gastroenterology

## 2023-12-13 DIAGNOSIS — D122 Benign neoplasm of ascending colon: Secondary | ICD-10-CM | POA: Diagnosis not present

## 2023-12-13 DIAGNOSIS — D123 Benign neoplasm of transverse colon: Secondary | ICD-10-CM

## 2023-12-13 DIAGNOSIS — E7800 Pure hypercholesterolemia, unspecified: Secondary | ICD-10-CM | POA: Insufficient documentation

## 2023-12-13 DIAGNOSIS — Z87891 Personal history of nicotine dependence: Secondary | ICD-10-CM | POA: Diagnosis not present

## 2023-12-13 DIAGNOSIS — K635 Polyp of colon: Secondary | ICD-10-CM | POA: Diagnosis not present

## 2023-12-13 DIAGNOSIS — Z800 Family history of malignant neoplasm of digestive organs: Secondary | ICD-10-CM

## 2023-12-13 DIAGNOSIS — Z860101 Personal history of adenomatous and serrated colon polyps: Secondary | ICD-10-CM

## 2023-12-13 DIAGNOSIS — Z860100 Personal history of colon polyps, unspecified: Secondary | ICD-10-CM

## 2023-12-13 DIAGNOSIS — K219 Gastro-esophageal reflux disease without esophagitis: Secondary | ICD-10-CM | POA: Diagnosis not present

## 2023-12-13 DIAGNOSIS — Z1211 Encounter for screening for malignant neoplasm of colon: Secondary | ICD-10-CM | POA: Insufficient documentation

## 2023-12-13 DIAGNOSIS — I10 Essential (primary) hypertension: Secondary | ICD-10-CM | POA: Diagnosis not present

## 2023-12-13 DIAGNOSIS — K5730 Diverticulosis of large intestine without perforation or abscess without bleeding: Secondary | ICD-10-CM | POA: Diagnosis not present

## 2023-12-13 HISTORY — PX: COLONOSCOPY: SHX5424

## 2023-12-13 LAB — HM COLONOSCOPY

## 2023-12-13 SURGERY — COLONOSCOPY
Anesthesia: General

## 2023-12-13 MED ORDER — PROPOFOL 500 MG/50ML IV EMUL
INTRAVENOUS | Status: DC | PRN
Start: 2023-12-13 — End: 2023-12-13
  Administered 2023-12-13: 150 ug/kg/min via INTRAVENOUS

## 2023-12-13 MED ORDER — LACTATED RINGERS IV SOLN: INTRAVENOUS | Status: DC

## 2023-12-13 MED ORDER — PROPOFOL 10 MG/ML IV BOLUS
INTRAVENOUS | Status: DC | PRN
  Administered 2023-12-13: 50 mg via INTRAVENOUS

## 2023-12-13 NOTE — Op Note (Signed)
 [facility] Patient Name: Teresa Howe Procedure Date: 12/13/2023 8:44 AM MRN: [id number] Date of Birth: October 30, 1945 Attending MD: Toribio Fortune , , [id number] CSN: [id number] Age: 78 Admit Type: Outpatient Procedure:                Colonoscopy Indications:              Surveillance: Personal history of adenomatous                            polyps on last colonoscopy 5 years ago Providers:                Toribio Fortune, Madelin Hunter, RN, Jon Loge Referring MD:              Medicines:                Monitored Anesthesia Care Complications:            No immediate complications. Estimated Blood Loss:     Estimated blood loss: none. Procedure:                Pre-Anesthesia Assessment:                           - Prior to the procedure, a History and Physical                            was performed, and patient medications, allergies                            and sensitivities were reviewed. The patient's                            tolerance of previous anesthesia was reviewed.                           - The risks and benefits of the procedure and the                            sedation options and risks were discussed with the                            patient. All questions were answered and informed                            consent was obtained.                           - ASA Grade Assessment: II - A patient with mild                            systemic disease.                           After obtaining informed consent, the colonoscope                            was passed under direct  vision. Throughout the                            procedure, the patient's blood pressure, pulse, and                            oxygen saturations were monitored continuously. The                            [id number]-[id number] ([id number]) Peds Colon was introduced                            through the anus and advanced to the the cecum,                            identified by  appendiceal orifice and ileocecal                            valve. The colonoscopy was performed without                            difficulty. The patient tolerated the procedure                            well. The quality of the bowel preparation was good. Scope In: 8:54:26 AM Scope Out: 9:14:38 AM Scope Withdrawal Time: 0 hours 9 minutes 8 seconds  Total Procedure Duration: 0 hours 20 minutes 12 seconds  Findings:      The perianal and digital rectal examinations were normal.      Four sessile polyps were found in the transverse colon and ascending       colon. The polyps were 3 to 5 mm in size. These polyps were removed with       a cold snare. Resection and retrieval were complete.      Scattered small-mouthed diverticula were found in the sigmoid colon.      The retroflexed view of the distal rectum and anal verge was normal and       showed no anal or rectal abnormalities. Impression:               - Four 3 to 5 mm polyps in the transverse colon and                            in the ascending colon, removed with a cold snare.                            Resected and retrieved.                           - Diverticulosis in the sigmoid colon.                           - The distal rectum and anal verge are normal on  retroflexion view. Moderate Sedation:      Per Anesthesia Care Recommendation:           - Discharge patient to home (ambulatory).                           - Resume previous diet.                           - Await pathology results.                           - Repeat colonoscopy for surveillance based on                            pathology results. Procedure Code(s):        --- Professional ---                           [phone], Colonoscopy, flexible; with removal of                            tumor(s), polyp(s), or other lesion(s) by snare                            technique Diagnosis Code(s):        --- Professional ---                            Z86.010, Personal history of colonic polyps                           D12.3, Benign neoplasm of transverse colon (hepatic                            flexure or splenic flexure)                           D12.2, Benign neoplasm of ascending colon                           K57.30, Diverticulosis of large intestine without                            perforation or abscess without bleeding CPT copyright 2022 American Medical Association. All rights reserved. The codes documented in this report are preliminary and upon coder review may  be revised to meet current compliance requirements. Toribio Fortune, MD Toribio Fortune,  12/13/2023 9:23:11 AM This report has been signed electronically. Number of Addenda: 0

## 2023-12-13 NOTE — Discharge Instructions (Addendum)
 You are being discharged to home.  Resume your previous diet.  We are waiting for your pathology results.  Your physician has recommended a repeat colonoscopy for surveillance based on pathology results.

## 2023-12-13 NOTE — Anesthesia Postprocedure Evaluation (Signed)
 Anesthesia Post Note  Patient: Teresa Howe  Procedure(s) Performed: COLONOSCOPY  Patient location during evaluation: PACU Anesthesia Type: General Level of consciousness: awake and alert Pain management: pain level controlled Vital Signs Assessment: post-procedure vital signs reviewed and stable Respiratory status: spontaneous breathing, nonlabored ventilation, respiratory function stable and patient connected to nasal cannula oxygen Cardiovascular status: blood pressure returned to baseline and stable Postop Assessment: no apparent nausea or vomiting Anesthetic complications: no   No notable events documented.   Last Vitals:  Vitals:   12/13/23 0719 12/13/23 0918  BP: (!) 144/76 (!) 104/55  Pulse: 71 69  Resp: 17 15  Temp: 36.6 C 36.4 C  SpO2: 99% 99%    Last Pain:  Vitals:   12/13/23 0918  TempSrc: Oral  PainSc:                  Andrea Limes

## 2023-12-13 NOTE — Transfer of Care (Signed)
 Immediate Anesthesia Transfer of Care Note  Patient: AZALYNN MAXIM  Procedure(s) Performed: COLONOSCOPY  Patient Location: Endoscopy Unit  Anesthesia Type:General  Level of Consciousness: awake, alert , oriented, and patient cooperative  Airway & Oxygen Therapy: Patient Spontanous Breathing  Post-op Assessment: Report given to RN, Post -op Vital signs reviewed and stable, and Patient moving all extremities X 4  Post vital signs: Reviewed and stable  Last Vitals:  Vitals Value Taken Time  BP 104/55 12/13/23 09:18  Temp 36.4 C 12/13/23 09:18  Pulse 69 12/13/23 09:18  Resp 15 12/13/23 09:18  SpO2 99 % 12/13/23 09:18    Last Pain:  Vitals:   12/13/23 0918  TempSrc: Oral  PainSc:       Patients Stated Pain Goal: 7 (12/13/23 0719)  Complications: No notable events documented.

## 2023-12-13 NOTE — Anesthesia Preprocedure Evaluation (Addendum)
 Anesthesia Evaluation  Patient identified by MRN, date of birth, ID band Patient awake    Reviewed: Allergy & Precautions, H&P , NPO status , Patient's Chart, lab work & pertinent test results  Airway Mallampati: II  TM Distance: >3 FB Neck ROM: Full    Dental  (+) Upper Dentures, Lower Dentures   Pulmonary former smoker   Pulmonary exam normal breath sounds clear to auscultation       Cardiovascular hypertension, Normal cardiovascular exam Rhythm:Regular Rate:Normal     Neuro/Psych negative neurological ROS  negative psych ROS   GI/Hepatic negative GI ROS, Neg liver ROS,,,  Endo/Other  negative endocrine ROS    Renal/GU negative Renal ROS  negative genitourinary   Musculoskeletal negative musculoskeletal ROS (+)    Abdominal   Peds negative pediatric ROS (+)  Hematology negative hematology ROS (+)   Anesthesia Other Findings   Reproductive/Obstetrics negative OB ROS                              Anesthesia Physical Anesthesia Plan  ASA: 2  Anesthesia Plan: General   Post-op Pain Management:    Induction: Intravenous  PONV Risk Score and Plan:   Airway Management Planned: Nasal Cannula  Additional Equipment:   Intra-op Plan:   Post-operative Plan:   Informed Consent: I have reviewed the patients History and Physical, chart, labs and discussed the procedure including the risks, benefits and alternatives for the proposed anesthesia with the patient or authorized representative who has indicated his/her understanding and acceptance.     Dental advisory given  Plan Discussed with: CRNA  Anesthesia Plan Comments:          Anesthesia Quick Evaluation

## 2023-12-13 NOTE — H&P (Addendum)
 Teresa Howe is an 78 y.o. female.   Chief Complaint: History of colon polyps HPI: 78 year old female with medical history of GERD, hyperlipidemia and hypertension, coming for history of colon polyps and family history of colon cancer.  Last colonoscopy in 2020, had one TA.   The patient denies having any complaints such as melena, hematochezia, abdominal pain or distention, change in her bowel movement consistency or frequency, no changes in weight recently.  Father was diagnosed with colon cancer after age 35.   Past Medical History:  Diagnosis Date   GERD (gastroesophageal reflux disease)    Hypercholesteremia    Hypertension     Past Surgical History:  Procedure Laterality Date   ABDOMINAL HYSTERECTOMY     BIOPSY  07/14/2022   Procedure: BIOPSY;  Surgeon: Eartha Angelia Sieving, MD;  [facility];  Service: Gastroenterology;;   CATARACT EXTRACTION W/PHACO Right 06/02/2021   Procedure: CATARACT EXTRACTION PHACO AND INTRAOCULAR LENS PLACEMENT (IOC);  Surgeon: Harrie Agent, MD;  [facility]: [facility];  Service: Ophthalmology;  Laterality: Right;  CDE: 16.00   CATARACT EXTRACTION W/PHACO Left 06/16/2021   Procedure: CATARACT EXTRACTION PHACO AND INTRAOCULAR LENS PLACEMENT (IOC);  Surgeon: Harrie Agent, MD;  Location: [facility];  Service: Ophthalmology;  Laterality: Left;  CDE:15.56   COLONOSCOPY N/A 03/01/2013   Procedure: COLONOSCOPY;  Surgeon: Claudis RAYMOND Rivet, MD;  [facility];  Service: Endoscopy;  Laterality: N/A;  830-moved to 930 Ann to notify pt   COLONOSCOPY N/A 11/16/2018   Procedure: COLONOSCOPY;  Surgeon: Rivet Claudis RAYMOND, MD;  [facility];  Service: Endoscopy;  Laterality: N/A;  930   ESOPHAGOGASTRODUODENOSCOPY (EGD) WITH PROPOFOL  N/A 07/14/2022   Procedure: ESOPHAGOGASTRODUODENOSCOPY (EGD) WITH PROPOFOL ;  Surgeon: Eartha Angelia Sieving, MD;  [facility];  Service: Gastroenterology;  Laterality: N/A;  2:00PM;ASA 1-2    ESOPHAGOGASTRODUODENOSCOPY (EGD) WITH PROPOFOL  N/A 11/06/2022   Procedure: ESOPHAGOGASTRODUODENOSCOPY (EGD) WITH PROPOFOL ;  Surgeon: Eartha Angelia Sieving, MD;  [facility];  Service: Gastroenterology;  Laterality: N/A;  11:30AM, ASA 2   POLYPECTOMY  11/16/2018   Procedure: POLYPECTOMY;  Surgeon: Rivet Claudis RAYMOND, MD;  [facility];  Service: Endoscopy;;  colon     Family History  Problem Relation Age of Onset   Colon cancer Father    Social History:  reports that she has quit smoking. Her smoking use included cigarettes. She has a 2 pack-year smoking history. She has never used smokeless tobacco. She reports that she does not currently use alcohol. She reports that she does not use drugs.  Allergies: No Known Allergies  Medications Prior to Admission  Medication Sig Dispense Refill   atorvastatin (LIPITOR) 40 MG tablet Take 40 mg by mouth at bedtime.     lisinopril (ZESTRIL) 20 MG tablet Take 20 mg by mouth daily.     omeprazole  (PRILOSEC) 40 MG capsule Take 1 capsule (40 mg total) by mouth daily. 90 capsule 3    No results found for this or any previous visit (from the past 48 hours). No results found.  Review of Systems  All other systems reviewed and are negative.   Blood pressure (!) 144/76, pulse 71, temperature 97.9 F (36.6 C), temperature source Oral, resp. rate 17, height 4' 11 (1.499 m), weight 50.8 kg, SpO2 99%. Physical Exam  GENERAL: The patient is AO x3, in no acute distress. HEENT: Head is normocephalic and atraumatic. EOMI are intact. Mouth is well hydrated and without lesions. NECK: Supple. No  masses LUNGS: Clear to auscultation. No presence of rhonchi/wheezing/rales. Adequate chest expansion HEART: RRR, normal s1 and s2. ABDOMEN: Soft, nontender, no guarding, no peritoneal signs, and nondistended. BS +. No masses. EXTREMITIES: Without any cyanosis, clubbing, rash, lesions or edema. NEUROLOGIC: AOx3, no focal motor deficit. SKIN: no  jaundice, no rashes  Assessment/Plan 78 year old female with medical history of GERD, hyperlipidemia and hypertension, coming for history of colon polyps and family history of colon cancer.will proceed with colonoscopy  Toribio Eartha Flavors, MD 12/13/2023, 7:39 AM

## 2023-12-14 ENCOUNTER — Ambulatory Visit: Payer: Self-pay | Admitting: Gastroenterology

## 2023-12-14 LAB — SURGICAL PATHOLOGY

## 2023-12-15 ENCOUNTER — Encounter (HOSPITAL_COMMUNITY): Payer: Self-pay | Admitting: Gastroenterology

## 2023-12-15 ENCOUNTER — Encounter (INDEPENDENT_AMBULATORY_CARE_PROVIDER_SITE_OTHER): Payer: Self-pay | Admitting: *Deleted

## 2023-12-15 NOTE — Progress Notes (Signed)
 5 yr TCS noted in recall Patient result letter mailed procedure note and pathology result faxed to PCP

## 2023-12-21 DIAGNOSIS — D485 Neoplasm of uncertain behavior of skin: Secondary | ICD-10-CM | POA: Diagnosis not present

## 2023-12-21 DIAGNOSIS — L570 Actinic keratosis: Secondary | ICD-10-CM | POA: Diagnosis not present

## 2024-01-06 DIAGNOSIS — S0501XA Injury of conjunctiva and corneal abrasion without foreign body, right eye, initial encounter: Secondary | ICD-10-CM | POA: Diagnosis not present

## 2024-01-07 DIAGNOSIS — S0501XD Injury of conjunctiva and corneal abrasion without foreign body, right eye, subsequent encounter: Secondary | ICD-10-CM | POA: Diagnosis not present

## 2024-01-20 DIAGNOSIS — C44622 Squamous cell carcinoma of skin of right upper limb, including shoulder: Secondary | ICD-10-CM | POA: Diagnosis not present

## 2024-01-20 DIAGNOSIS — L905 Scar conditions and fibrosis of skin: Secondary | ICD-10-CM | POA: Diagnosis not present

## 2024-01-20 DIAGNOSIS — C44612 Basal cell carcinoma of skin of right upper limb, including shoulder: Secondary | ICD-10-CM | POA: Diagnosis not present

## 2024-02-03 DIAGNOSIS — D485 Neoplasm of uncertain behavior of skin: Secondary | ICD-10-CM | POA: Diagnosis not present

## 2024-02-03 DIAGNOSIS — L570 Actinic keratosis: Secondary | ICD-10-CM | POA: Diagnosis not present

## 2024-02-03 DIAGNOSIS — C44622 Squamous cell carcinoma of skin of right upper limb, including shoulder: Secondary | ICD-10-CM | POA: Diagnosis not present

## 2024-02-09 ENCOUNTER — Encounter (INDEPENDENT_AMBULATORY_CARE_PROVIDER_SITE_OTHER): Payer: Self-pay | Admitting: Gastroenterology

## 2024-02-17 DIAGNOSIS — C44319 Basal cell carcinoma of skin of other parts of face: Secondary | ICD-10-CM | POA: Diagnosis not present

## 2024-02-17 DIAGNOSIS — L570 Actinic keratosis: Secondary | ICD-10-CM | POA: Diagnosis not present

## 2024-02-23 DIAGNOSIS — E13610 Other specified diabetes mellitus with diabetic neuropathic arthropathy: Secondary | ICD-10-CM | POA: Diagnosis not present

## 2024-02-23 DIAGNOSIS — N1831 Chronic kidney disease, stage 3a: Secondary | ICD-10-CM | POA: Diagnosis not present

## 2024-02-23 DIAGNOSIS — E1165 Type 2 diabetes mellitus with hyperglycemia: Secondary | ICD-10-CM | POA: Diagnosis not present

## 2024-02-23 DIAGNOSIS — E782 Mixed hyperlipidemia: Secondary | ICD-10-CM | POA: Diagnosis not present

## 2024-02-29 DIAGNOSIS — Z0000 Encounter for general adult medical examination without abnormal findings: Secondary | ICD-10-CM | POA: Diagnosis not present

## 2024-02-29 DIAGNOSIS — I700 Atherosclerosis of aorta: Secondary | ICD-10-CM | POA: Diagnosis not present

## 2024-02-29 DIAGNOSIS — J439 Emphysema, unspecified: Secondary | ICD-10-CM | POA: Diagnosis not present

## 2024-02-29 DIAGNOSIS — Z0001 Encounter for general adult medical examination with abnormal findings: Secondary | ICD-10-CM | POA: Diagnosis not present

## 2024-02-29 DIAGNOSIS — I129 Hypertensive chronic kidney disease with stage 1 through stage 4 chronic kidney disease, or unspecified chronic kidney disease: Secondary | ICD-10-CM | POA: Diagnosis not present

## 2024-02-29 DIAGNOSIS — K219 Gastro-esophageal reflux disease without esophagitis: Secondary | ICD-10-CM | POA: Diagnosis not present

## 2024-02-29 DIAGNOSIS — M792 Neuralgia and neuritis, unspecified: Secondary | ICD-10-CM | POA: Diagnosis not present

## 2024-02-29 DIAGNOSIS — M810 Age-related osteoporosis without current pathological fracture: Secondary | ICD-10-CM | POA: Diagnosis not present

## 2024-02-29 DIAGNOSIS — E782 Mixed hyperlipidemia: Secondary | ICD-10-CM | POA: Diagnosis not present

## 2024-02-29 DIAGNOSIS — I10 Essential (primary) hypertension: Secondary | ICD-10-CM | POA: Diagnosis not present

## 2024-02-29 DIAGNOSIS — E1165 Type 2 diabetes mellitus with hyperglycemia: Secondary | ICD-10-CM | POA: Diagnosis not present

## 2024-02-29 DIAGNOSIS — R152 Fecal urgency: Secondary | ICD-10-CM | POA: Diagnosis not present
